# Patient Record
Sex: Male | Born: 1970 | Race: Black or African American | Hispanic: No | Marital: Single | Smoking: Current every day smoker
Health system: Southern US, Community
[De-identification: ages and names within clinical notes are randomized; demographics above are authoritative.]

## PROBLEM LIST (undated history)

## (undated) DIAGNOSIS — R011 Cardiac murmur, unspecified: Secondary | ICD-10-CM

---

## 2004-04-09 ENCOUNTER — Ambulatory Visit: Payer: Self-pay | Admitting: Internal Medicine

## 2004-04-12 ENCOUNTER — Ambulatory Visit: Payer: Self-pay | Admitting: Internal Medicine

## 2004-07-23 ENCOUNTER — Emergency Department (HOSPITAL_COMMUNITY): Admission: EM | Admit: 2004-07-23 | Discharge: 2004-07-23 | Payer: Self-pay | Admitting: Emergency Medicine

## 2005-07-21 ENCOUNTER — Emergency Department (HOSPITAL_COMMUNITY): Admission: EM | Admit: 2005-07-21 | Discharge: 2005-07-21 | Payer: Self-pay | Admitting: Emergency Medicine

## 2006-05-28 ENCOUNTER — Emergency Department (HOSPITAL_COMMUNITY): Admission: EM | Admit: 2006-05-28 | Discharge: 2006-05-28 | Payer: Self-pay | Admitting: *Deleted

## 2006-06-10 ENCOUNTER — Emergency Department (HOSPITAL_COMMUNITY): Admission: EM | Admit: 2006-06-10 | Discharge: 2006-06-10 | Payer: Self-pay | Admitting: Emergency Medicine

## 2006-09-29 ENCOUNTER — Emergency Department (HOSPITAL_COMMUNITY): Admission: EM | Admit: 2006-09-29 | Discharge: 2006-09-29 | Payer: Self-pay | Admitting: Emergency Medicine

## 2007-11-24 ENCOUNTER — Emergency Department (HOSPITAL_COMMUNITY): Admission: EM | Admit: 2007-11-24 | Discharge: 2007-11-24 | Payer: Self-pay | Admitting: Emergency Medicine

## 2008-09-08 ENCOUNTER — Emergency Department (HOSPITAL_COMMUNITY): Admission: EM | Admit: 2008-09-08 | Discharge: 2008-09-08 | Payer: Self-pay | Admitting: *Deleted

## 2008-09-28 ENCOUNTER — Emergency Department (HOSPITAL_COMMUNITY): Admission: EM | Admit: 2008-09-28 | Discharge: 2008-09-28 | Payer: Self-pay | Admitting: Emergency Medicine

## 2009-07-07 ENCOUNTER — Emergency Department (HOSPITAL_COMMUNITY): Admission: EM | Admit: 2009-07-07 | Discharge: 2009-07-07 | Payer: Self-pay | Admitting: Emergency Medicine

## 2010-08-19 ENCOUNTER — Emergency Department (HOSPITAL_COMMUNITY)
Admission: EM | Admit: 2010-08-19 | Discharge: 2010-08-19 | Payer: Self-pay | Source: Home / Self Care | Admitting: Emergency Medicine

## 2010-08-21 LAB — DIFFERENTIAL
Basophils Absolute: 0 10*3/uL (ref 0.0–0.1)
Basophils Relative: 0 % (ref 0–1)
Eosinophils Absolute: 0.4 10*3/uL (ref 0.0–0.7)
Eosinophils Relative: 9 % — ABNORMAL HIGH (ref 0–5)
Lymphocytes Relative: 30 % (ref 12–46)
Lymphs Abs: 1.3 10*3/uL (ref 0.7–4.0)
Monocytes Absolute: 0.4 10*3/uL (ref 0.1–1.0)
Monocytes Relative: 9 % (ref 3–12)
Neutro Abs: 2.2 10*3/uL (ref 1.7–7.7)
Neutrophils Relative %: 52 % (ref 43–77)

## 2010-08-21 LAB — COMPREHENSIVE METABOLIC PANEL
ALT: 19 U/L (ref 0–53)
AST: 23 U/L (ref 0–37)
Albumin: 3.7 g/dL (ref 3.5–5.2)
Alkaline Phosphatase: 90 U/L (ref 39–117)
BUN: 10 mg/dL (ref 6–23)
CO2: 25 mEq/L (ref 19–32)
Calcium: 9.1 mg/dL (ref 8.4–10.5)
Chloride: 105 mEq/L (ref 96–112)
Creatinine, Ser: 0.94 mg/dL (ref 0.4–1.5)
GFR calc Af Amer: >60 mL/min (ref >60)
GFR calc non Af Amer: >60 mL/min (ref >60)
Glucose, Bld: 128 mg/dL — ABNORMAL HIGH (ref 70–99)
Potassium: 3.4 mEq/L — ABNORMAL LOW (ref 3.5–5.1)
Sodium: 138 mEq/L (ref 135–145)
Total Bilirubin: 0.4 mg/dL (ref 0.3–1.2)
Total Protein: 6.7 g/dL (ref 6.0–8.3)

## 2010-08-21 LAB — CBC
HCT: 39.7 % (ref 39.0–52.0)
Hemoglobin: 12.6 g/dL — ABNORMAL LOW (ref 13.0–17.0)
MCH: 26.4 pg (ref 26.0–34.0)
MCHC: 31.7 g/dL (ref 30.0–36.0)
MCV: 83.1 fL (ref 78.0–100.0)
Platelets: 214 10*3/uL (ref 150–400)
RBC: 4.78 MIL/uL (ref 4.22–5.81)
RDW: 13.1 % (ref 11.5–15.5)
WBC: 4.3 10*3/uL (ref 4.0–10.5)

## 2010-08-21 LAB — POCT CARDIAC MARKERS
CKMB, poc: <1 ng/mL — ABNORMAL LOW (ref 1.0–8.0)
Myoglobin, poc: 66 ng/mL (ref 12–200)
Troponin i, poc: <0.05 ng/mL (ref 0.00–0.09)

## 2010-08-21 LAB — D-DIMER, QUANTITATIVE: D-Dimer, Quant: <0.22 ug/mL-FEU (ref 0.00–0.48)

## 2010-11-14 ENCOUNTER — Emergency Department (HOSPITAL_COMMUNITY)
Admission: EM | Admit: 2010-11-14 | Discharge: 2010-11-14 | Disposition: A | Discharge status: Home / Self Care | Payer: Self-pay | Attending: Emergency Medicine | Admitting: Emergency Medicine

## 2010-11-14 DIAGNOSIS — Z202 Contact with and (suspected) exposure to infections with a predominantly sexual mode of transmission: Secondary | ICD-10-CM | POA: Insufficient documentation

## 2010-11-15 LAB — GC/CHLAMYDIA PROBE AMP, GENITAL
Chlamydia, DNA Probe: NEGATIVE
GC Probe Amp, Genital: NEGATIVE

## 2012-03-20 ENCOUNTER — Encounter (HOSPITAL_COMMUNITY): Payer: Self-pay | Admitting: *Deleted

## 2012-03-20 ENCOUNTER — Emergency Department (HOSPITAL_COMMUNITY)
Admission: EM | Admit: 2012-03-20 | Discharge: 2012-03-20 | Disposition: A | Discharge status: Home / Self Care | Payer: Self-pay | Attending: Emergency Medicine | Admitting: Emergency Medicine

## 2012-03-20 DIAGNOSIS — F17200 Nicotine dependence, unspecified, uncomplicated: Secondary | ICD-10-CM | POA: Insufficient documentation

## 2012-03-20 DIAGNOSIS — R55 Syncope and collapse: Secondary | ICD-10-CM | POA: Insufficient documentation

## 2012-03-20 DIAGNOSIS — R631 Polydipsia: Secondary | ICD-10-CM | POA: Insufficient documentation

## 2012-03-20 LAB — CBC WITH DIFFERENTIAL/PLATELET
Basophils Absolute: 0 10*3/uL (ref 0.0–0.1)
Basophils Relative: 0 % (ref 0–1)
Eosinophils Absolute: 0.4 10*3/uL (ref 0.0–0.7)
Eosinophils Relative: 8 % — ABNORMAL HIGH (ref 0–5)
HCT: 42.1 % (ref 39.0–52.0)
Hemoglobin: 13.6 g/dL (ref 13.0–17.0)
Lymphocytes Relative: 25 % (ref 12–46)
Lymphs Abs: 1.1 10*3/uL (ref 0.7–4.0)
MCH: 27 pg (ref 26.0–34.0)
MCHC: 32.3 g/dL (ref 30.0–36.0)
MCV: 83.5 fL (ref 78.0–100.0)
Monocytes Absolute: 0.4 10*3/uL (ref 0.1–1.0)
Monocytes Relative: 10 % (ref 3–12)
Neutro Abs: 2.6 10*3/uL (ref 1.7–7.7)
Neutrophils Relative %: 58 % (ref 43–77)
Platelets: 205 10*3/uL (ref 150–400)
RBC: 5.04 MIL/uL (ref 4.22–5.81)
RDW: 13.4 % (ref 11.5–15.5)
WBC: 4.5 10*3/uL (ref 4.0–10.5)

## 2012-03-20 LAB — POCT I-STAT, CHEM 8
BUN: 13 mg/dL (ref 6–23)
Calcium, Ion: 1.2 mmol/L (ref 1.12–1.23)
Chloride: 104 mEq/L (ref 96–112)
Creatinine, Ser: 1.1 mg/dL (ref 0.50–1.35)
Glucose, Bld: 120 mg/dL — ABNORMAL HIGH (ref 70–99)
HCT: 45 % (ref 39.0–52.0)
Hemoglobin: 15.3 g/dL (ref 13.0–17.0)
Potassium: 3.6 mEq/L (ref 3.5–5.1)
Sodium: 142 mEq/L (ref 135–145)
TCO2: 26 mmol/L (ref 0–100)

## 2012-03-20 LAB — GLUCOSE, CAPILLARY: Glucose-Capillary: 106 mg/dL — ABNORMAL HIGH (ref 70–99)

## 2012-03-20 NOTE — ED Notes (Signed)
The patient is AOx4 and comfortable with the discharge instructions. 

## 2012-03-20 NOTE — ED Notes (Signed)
Pt. C/o generalized  H/a and mild dizziness with ambulation; believes he is anemic.

## 2012-03-20 NOTE — ED Provider Notes (Signed)
History     CSN: [id number]  Arrival date & time 03/20/12  1751   First MD Initiated Contact with Patient 03/20/12 1816      Chief Complaint  Patient presents with  . Polydipsia  . Near Syncope    (Consider location/radiation/quality/duration/timing/severity/associated sxs/prior treatment) HPI Comments: Patient reports a 1 month history of increased thirst, fatigue and numbness in fingertips. He reports being more thirsty this past month and craving sweet foods as well. The cravings and increased thirst have been daily for the past month. He reports being more fatigued than normal. The numbness in his fingertips is more pronounced in the cold, according to the patient. He is concerned that he has diabetes. He does not have a family history of diabetes. He denies abdominal pain, vomiting, blurred vision, polyuria, numbness/tingling of feet/toes.    History reviewed. No pertinent past medical history.  History reviewed. No pertinent past surgical history.  History reviewed. No pertinent family history.  History  Substance Use Topics  . Smoking status: Current Everyday Smoker    Types: Cigarettes  . Smokeless tobacco: Not on file  . Alcohol Use: Yes     occ      Review of Systems  Constitutional: Positive for fatigue. Negative for fever, chills and diaphoresis.  HENT: Negative for trouble swallowing.   Eyes: Negative for visual disturbance.  Respiratory: Negative for shortness of breath and wheezing.   Cardiovascular: Negative for chest pain.  Gastrointestinal: Negative for nausea, vomiting, abdominal pain, diarrhea and constipation.  Genitourinary: Negative for dysuria, frequency, decreased urine volume and difficulty urinating.  Musculoskeletal: Negative for back pain.  Skin: Negative for rash and wound.  Neurological: Positive for numbness. Negative for weakness, light-headedness and headaches.    Allergies  Review of patient's allergies indicates no known  allergies.  Home Medications  No current outpatient prescriptions on file.  BP 96/68  Pulse 95  Temp 98.3 F (36.8 C)  Resp 18  SpO2 97%  Physical Exam  Nursing note and vitals reviewed. Constitutional: He is oriented to person, place, and time. He appears well-developed and well-nourished. No distress.  HENT:  Head: Normocephalic and atraumatic.  Eyes: Conjunctivae are normal. No scleral icterus.  Neck: Normal range of motion.  Cardiovascular: Normal rate and regular rhythm.  Exam reveals no gallop and no friction rub.   No murmur heard. Pulmonary/Chest: Effort normal and breath sounds normal. No respiratory distress. He has no wheezes. He has no rales. He exhibits no tenderness.  Abdominal: Soft. There is no tenderness.  Musculoskeletal: Normal range of motion.  Neurological: He is alert and oriented to person, place, and time.  Skin: Skin is warm and dry. He is not diaphoretic.  Psychiatric: He has a normal mood and affect. His behavior is normal.    ED Course  Procedures (including critical care time)  Labs Reviewed  GLUCOSE, CAPILLARY - Abnormal; Notable for the following:    Glucose-Capillary 106 (*)     All other components within normal limits  CBC WITH DIFFERENTIAL - Abnormal; Notable for the following:    Eosinophils Relative 8 (*)     All other components within normal limits  POCT I-STAT, CHEM 8 - Abnormal; Notable for the following:    Glucose, Bld 120 (*)     All other components within normal limits   No results found.   No diagnosis found.    MDM  6:41 PM Patient is concerned about having diabetes. His current blood glucose is 120 and  the patient reports not eating since yesterday. I have ordered a CBC to check for anemia. I explained to him he would ultimately have to follow up with a Primary Care Physician to have a fasting glucose check. His blood sugar is not high enough to diagnose him with diabetes today. He should try to eat more protein and  less sugar to control any high increases in blood sugar.   7:24 PM Patient has no signs of infection or anemia based on lab results. I will discharge him with instructions for diet modification and follow up with a low-cost primary care doctor in the area. Plan discussed with Dr. Ignacia Palma who is agreeable.      Emilia Beck, PA-C 03/20/12 [phone]

## 2012-03-20 NOTE — ED Notes (Signed)
Pt reports having excessive thirst and occ near syncope episodes, wants to be checked for anemia and diabetes. No distress noted at triage, cbg 106.

## 2012-03-21 NOTE — ED Provider Notes (Signed)
Medical screening examination/treatment/procedure(s) were performed by non-physician practitioner and as supervising physician I was immediately available for consultation/collaboration.   Carleene Cooper III, MD 03/21/12 [phone]

## 2012-06-05 ENCOUNTER — Encounter (HOSPITAL_COMMUNITY): Payer: Self-pay | Admitting: *Deleted

## 2012-06-05 ENCOUNTER — Emergency Department (HOSPITAL_COMMUNITY)
Admission: EM | Admit: 2012-06-05 | Discharge: 2012-06-05 | Disposition: A | Discharge status: Home / Self Care | Payer: Self-pay | Attending: Emergency Medicine | Admitting: Emergency Medicine

## 2012-06-05 DIAGNOSIS — F17200 Nicotine dependence, unspecified, uncomplicated: Secondary | ICD-10-CM | POA: Insufficient documentation

## 2012-06-05 DIAGNOSIS — R229 Localized swelling, mass and lump, unspecified: Secondary | ICD-10-CM

## 2012-06-05 DIAGNOSIS — R221 Localized swelling, mass and lump, neck: Secondary | ICD-10-CM | POA: Insufficient documentation

## 2012-06-05 DIAGNOSIS — R220 Localized swelling, mass and lump, head: Secondary | ICD-10-CM | POA: Insufficient documentation

## 2012-06-05 MED ORDER — DIPHENHYDRAMINE HCL 25 MG PO TABS: 25.0000 mg | ORAL_TABLET | Freq: Four times a day (QID) | ORAL | Status: DC

## 2012-06-05 NOTE — ED Notes (Signed)
Pt has swollen area above left eyebrow that he states started a week and a half ago. Pt states that the swelling decreased twice since then and that it started swelling for the third time a couple of days ago.

## 2012-06-05 NOTE — ED Notes (Signed)
Pt has small swollen area above left eyebrow. Denies pain states "it itches a lot though." No redness noted.

## 2012-06-05 NOTE — ED Provider Notes (Signed)
History     CSN: [id number]  Arrival date & time 06/05/12  1912   First MD Initiated Contact with Patient 06/05/12 2001      Chief Complaint  Patient presents with  . recurrent bump on head     (Consider location/radiation/quality/duration/timing/severity/associated sxs/prior treatment) HPI Comments: 41 year old male presents the emergency department complaining of an intermittent swollen area above his left eyebrow that he noted a week and half ago. He states the swelling has only come and gone twice and is very itchy when it is fair. He believes he may of gotten bit by something. Denies visual changes, rashes, fever or chills. He has not tried any alleviating factors. The swelling is not tender. Nothing in specific makes it come and go.  The history is provided by the patient.    History reviewed. No pertinent past medical history.  History reviewed. No pertinent past surgical history.  History reviewed. No pertinent family history.  History  Substance Use Topics  . Smoking status: Current Every Day Smoker    Types: Cigarettes  . Smokeless tobacco: Not on file  . Alcohol Use: Yes     occ      Review of Systems  Constitutional: Negative for fever and chills.  HENT: Negative for neck pain and neck stiffness.   Eyes: Negative for visual disturbance.  Respiratory: Negative for shortness of breath.   Cardiovascular: Negative for chest pain.  Musculoskeletal: Negative for arthralgias.  Skin: Negative for color change and rash.       Positive for swollen area  Neurological: Negative for headaches.    Allergies  Review of patient's allergies indicates no known allergies.  Home Medications  No current outpatient prescriptions on file.  BP 120/81  Temp 98.1 F (36.7 C) (Oral)  Resp 18  SpO2 96%  Physical Exam  Nursing note and vitals reviewed. Constitutional: He is oriented to person, place, and time.  HENT:  Head: Normocephalic.    Eyes: Conjunctivae  normal and EOM are normal.  Neck: Normal range of motion. Neck supple.  Cardiovascular: Normal rate, regular rhythm and normal heart sounds.   Pulmonary/Chest: Effort normal and breath sounds normal.  Musculoskeletal: Normal range of motion. He exhibits no edema.  Lymphadenopathy:    He has no cervical adenopathy.  Neurological: He is alert and oriented to person, place, and time.  Skin: Skin is warm, dry and intact. No rash noted.  Psychiatric: He has a normal mood and affect. His behavior is normal.    ED Course  Procedures (including critical care time)  Labs Reviewed - No data to display No results found.   1. Skin swelling       MDM  Wheel over left eyebrow consistent with mosquito bite. Not painful. No discharge. No evidence of infection. Will give benadryl for itch. Advised cold compressed and ibuprofen if needed.       Trevor Mace, PA-C 06/05/12 2043

## 2012-06-06 NOTE — ED Provider Notes (Signed)
Medical screening examination/treatment/procedure(s) were performed by non-physician practitioner and as supervising physician I was immediately available for consultation/collaboration.   Glynn Octave, MD 06/06/12 1155

## 2012-11-05 ENCOUNTER — Encounter (HOSPITAL_COMMUNITY): Payer: Self-pay | Admitting: *Deleted

## 2012-11-05 ENCOUNTER — Emergency Department (HOSPITAL_COMMUNITY)
Admission: EM | Admit: 2012-11-05 | Discharge: 2012-11-05 | Disposition: A | Discharge status: Home / Self Care | Payer: Self-pay | Attending: Emergency Medicine | Admitting: Emergency Medicine

## 2012-11-05 DIAGNOSIS — M545 Low back pain, unspecified: Secondary | ICD-10-CM | POA: Insufficient documentation

## 2012-11-05 DIAGNOSIS — F17200 Nicotine dependence, unspecified, uncomplicated: Secondary | ICD-10-CM | POA: Insufficient documentation

## 2012-11-05 DIAGNOSIS — M549 Dorsalgia, unspecified: Secondary | ICD-10-CM

## 2012-11-05 MED ORDER — OXYCODONE-ACETAMINOPHEN 5-325 MG PO TABS: 1.0000 | ORAL_TABLET | ORAL | Status: DC | PRN

## 2012-11-05 MED ORDER — IBUPROFEN 800 MG PO TABS
800.0000 mg | ORAL_TABLET | Freq: Once | ORAL | Status: AC
  Administered 2012-11-05: 800 mg via ORAL
  Filled 2012-11-05: qty 1

## 2012-11-05 NOTE — ED Provider Notes (Signed)
History     CSN: [id number]  Arrival date & time 11/05/12  0220   First MD Initiated Contact with Patient 11/05/12 0230      Chief Complaint  Patient presents with  . Back Pain     Patient is a 42 y.o. male presenting with back pain. The history is provided by the patient.  Back Pain Location:  Gluteal region Quality:  Aching Radiates to:  R thigh Pain severity:  Moderate Pain is:  Same all the time Onset quality:  Gradual Duration:  1 month Timing:  Intermittent Progression:  Worsening Chronicity:  New Relieved by:  Lying down Worsened by:  Ambulation Associated symptoms: no abdominal pain, no bladder incontinence, no bowel incontinence, no fever, no numbness, no weakness and no weight loss   Risk factors: no hx of cancer and no recent surgery      PMH - none No previous back surgeries No h/o IVDA   History  Substance Use Topics  . Smoking status: Current Every Day Smoker    Types: Cigarettes  . Smokeless tobacco: Not on file  . Alcohol Use: Yes     Comment: occ      Review of Systems  Constitutional: Negative for fever, weight loss and unexpected weight change.  Respiratory: Negative for shortness of breath.   Gastrointestinal: Negative for abdominal pain and bowel incontinence.  Genitourinary: Negative for bladder incontinence and difficulty urinating.  Musculoskeletal: Positive for back pain.  Neurological: Negative for weakness and numbness.  Psychiatric/Behavioral: Negative for agitation.  All other systems reviewed and are negative.    Allergies  Review of patient's allergies indicates no known allergies.  Home Medications   Current Outpatient Rx  Name  Route  Sig  Dispense  Refill  . oxyCODONE-acetaminophen (PERCOCET/ROXICET) 5-325 MG per tablet   Oral   Take 1 tablet by mouth every 4 (four) hours as needed for pain.   15 tablet   0     BP 124/76  Pulse 74  Temp(Src) 98.2 F (36.8 C) (Oral)  Resp 20  SpO2 97%  Physical  Exam CONSTITUTIONAL: Well developed/well nourished HEAD: Normocephalic/atraumatic EYES: EOMI/PERRL ENMT: Mucous membranes moist NECK: supple no meningeal signs SPINE:entire spine nontender, No bruising/crepitance/stepoffs noted to spine He has tenderness in right gluteal region but no bruising or erythema noted CV: S1/S2 noted, no murmurs/rubs/gallops noted LUNGS: Lungs are clear to auscultation bilaterally, no apparent distress ABDOMEN: soft, nontender, no rebound or guarding GU:no cva tenderness NEURO: Awake/alert, equal distal motor: hip flexion/knee flexion/extension, ankle dorsi/plantar flexion, great toe extension intact bilaterally, no clonus bilaterally, plantar reflex appropriate, no apparent sensory deficit in any dermatome.  Equal patellar/achilles reflex noted.  Pt is able to ambulate. EXTREMITIES: pulses normal, full ROM SKIN: warm, color normal PSYCH: no abnormalities of mood noted     ED Course  Procedures   1. Back pain     Pt well appeairng.  He has no neuro deficits.  He reports increased pain this month due to new job We discussed strict return precautions and proper back mechanics.  Stable for d/c  MDM  Nursing notes including past medical history and social history reviewed and considered in documentation         Joya Gaskins, MD 11/05/12 [phone]

## 2012-11-05 NOTE — ED Notes (Signed)
The pt has had lower back pain for one month.  No nv or diarrhea

## 2012-11-26 ENCOUNTER — Encounter (HOSPITAL_COMMUNITY): Payer: Self-pay | Admitting: *Deleted

## 2012-11-26 ENCOUNTER — Emergency Department (HOSPITAL_COMMUNITY)
Admission: EM | Admit: 2012-11-26 | Discharge: 2012-11-26 | Disposition: A | Discharge status: Home / Self Care | Payer: Self-pay | Attending: Emergency Medicine | Admitting: Emergency Medicine

## 2012-11-26 ENCOUNTER — Emergency Department (HOSPITAL_COMMUNITY): Payer: Self-pay

## 2012-11-26 DIAGNOSIS — J069 Acute upper respiratory infection, unspecified: Secondary | ICD-10-CM | POA: Insufficient documentation

## 2012-11-26 DIAGNOSIS — F17200 Nicotine dependence, unspecified, uncomplicated: Secondary | ICD-10-CM | POA: Insufficient documentation

## 2012-11-26 DIAGNOSIS — R509 Fever, unspecified: Secondary | ICD-10-CM | POA: Insufficient documentation

## 2012-11-26 NOTE — ED Notes (Signed)
Pt states that he is have congestion, slight productive cough, and chest soreness. Pt denies N/V/D, but states that he has been having sweating episodes and chills.

## 2012-11-26 NOTE — ED Provider Notes (Signed)
History     CSN: [id number]  Arrival date & time 11/26/12  0016   First MD Initiated Contact with Patient 11/26/12 [phone]      Chief Complaint  Patient presents with  . Cold like symptoms     Patient is a 42 y.o. male presenting with cough. The history is provided by the patient.  Cough Cough characteristics:  Productive Severity:  Mild Onset quality:  Gradual Duration:  1 day Timing:  Intermittent Chronicity:  New Smoker: yes   Relieved by:  None tried Associated symptoms: chills, fever and sinus congestion    Pt reports that starting yesterday he began to have sinus congestion and cough (denies hemoptysis) He reports he will have brief CP while coughing, but no other CP reported He reports mild SOB  PMH - none  History reviewed. No pertinent past surgical history.  History reviewed. No pertinent family history.  History  Substance Use Topics  . Smoking status: Current Every Day Smoker    Types: Cigarettes  . Smokeless tobacco: Not on file  . Alcohol Use: Yes     Comment: occ      Review of Systems  Constitutional: Positive for fever and chills.  Respiratory: Positive for cough.     Allergies  Review of patient's allergies indicates no known allergies.  Home Medications  No current outpatient prescriptions on file.  BP 126/87  Pulse 72  Temp(Src) 98 F (36.7 C) (Oral)  Resp 16  SpO2 100%  Physical Exam CONSTITUTIONAL: Well developed/well nourished HEAD: Normocephalic/atraumatic EYES: EOMI/PERRL ENMT: Mucous membranes moist, uvula midline, pharynx nonerythematous NECK: supple no meningeal signs CV: S1/S2 noted, no murmurs/rubs/gallops noted LUNGS: Lungs are clear to auscultation bilaterally, no apparent distress ABDOMEN: soft, nontender, no rebound or guarding NEURO: Pt is awake/alert, moves all extremitiesx4 EXTREMITIES: pulses normal, full ROM SKIN: warm, color normal PSYCH: no abnormalities of mood noted  ED Course  Procedures (including  critical care time)  Labs Reviewed - No data to display Dg Chest 2 View  11/26/2012  *RADIOLOGY REPORT*  Clinical Data: Chest pain  CHEST - 2 VIEW  Comparison: 08/19/2010  Findings: Lungs are clear. No pleural effusion or pneumothorax. The cardiomediastinal contours are within normal limits. The visualized bones and soft tissues are without significant appreciable abnormality.  IMPRESSION: No radiographic evidence of acute cardiopulmonary process.   Original Report Authenticated By: Jearld Lesch, M.D.      1. URI (upper respiratory infection)     Pt well appearing, reporting cough with congestion and CP only with cough.  I doubt PE/ACS or other cause of his CP.  He is well appearing without hypoxia on Room air.  Stable for d/c  MDM  Nursing notes including past medical history and social history reviewed and considered in documentation xrays reviewed and considered        Date: 11/26/2012  Rate: 90  Rhythm: normal sinus rhythm  QRS Axis: normal  Intervals: normal  ST/T Wave abnormalities: early repolarization  Conduction Disutrbances:none  Narrative Interpretation:   Old EKG Reviewed: none available at time of interpretation    Joya Gaskins, MD 11/26/12 [phone]

## 2013-01-12 ENCOUNTER — Emergency Department (HOSPITAL_COMMUNITY)
Admission: EM | Admit: 2013-01-12 | Discharge: 2013-01-12 | Disposition: A | Discharge status: Home / Self Care | Payer: Self-pay | Attending: Emergency Medicine | Admitting: Emergency Medicine

## 2013-01-12 DIAGNOSIS — F17200 Nicotine dependence, unspecified, uncomplicated: Secondary | ICD-10-CM | POA: Insufficient documentation

## 2013-01-12 DIAGNOSIS — M5412 Radiculopathy, cervical region: Secondary | ICD-10-CM | POA: Insufficient documentation

## 2013-01-12 DIAGNOSIS — Z791 Long term (current) use of non-steroidal anti-inflammatories (NSAID): Secondary | ICD-10-CM | POA: Insufficient documentation

## 2013-01-12 MED ORDER — NAPROXEN 500 MG PO TABS: 500.0000 mg | ORAL_TABLET | Freq: Two times a day (BID) | ORAL | Status: DC

## 2013-01-12 NOTE — ED Provider Notes (Signed)
Medical screening examination/treatment/procedure(s) were performed by non-physician practitioner and as supervising physician I was immediately available for consultation/collaboration.  Lyanne Co, MD 01/12/13 [phone]

## 2013-01-12 NOTE — ED Provider Notes (Signed)
History     CSN: [id number]  Arrival date & time 01/12/13  0248   First MD Initiated Contact with Patient 01/12/13 0518      No chief complaint on file.   (Consider location/radiation/quality/duration/timing/severity/associated sxs/prior treatment) HPI Comments: Patient presents to the ED with chief complaint of left arm numbness and tingling x3-4 weeks. States that his been intermittent. 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 He has not tried anything to alleviate his symptoms. Nothing makes his symptoms better or worse. The pain radiates from his neck down his arm on the left side. He denies any chest pain, shortness of breath, nausea, vomiting, diarrhea, constipation, numbness or tingling of the extremities.  The history is provided by the patient. No language interpreter was used.    No past medical history on file.  No past surgical history on file.  No family history on file.  History  Substance Use Topics  . Smoking status: Current Every Day Smoker    Types: Cigarettes  . Smokeless tobacco: Not on file  . Alcohol Use: Yes     Comment: occ      Review of Systems  All other systems reviewed and are negative.    Allergies  Review of patient's allergies indicates no known allergies.  Home Medications   Current Outpatient Rx  Name  Route  Sig  Dispense  Refill  . naproxen (NAPROSYN) 500 MG tablet   Oral   Take 1 tablet (500 mg total) by mouth 2 (two) times daily with a meal.   30 tablet   0     BP 110/73  Pulse 75  SpO2 99%  Physical Exam  Nursing note and vitals reviewed. Constitutional: He is oriented to person, place, and time. He appears well-developed and well-nourished.  HENT:  Head: Normocephalic and atraumatic.  Eyes: Conjunctivae and EOM are normal. Pupils are equal, round, and reactive to light. Right eye exhibits no discharge. Left eye exhibits no discharge. No scleral icterus.   Neck: Normal range of motion. Neck supple. No JVD present.  Cardiovascular: Normal rate, regular rhythm, normal heart sounds and intact distal pulses.  Exam reveals no gallop and no friction rub.   No murmur heard. Pulmonary/Chest: Effort normal and breath sounds normal. No respiratory distress. He has no wheezes. He has no rales. He exhibits no tenderness.  Abdominal: Soft. Bowel sounds are normal. He exhibits no distension and no mass. There is no tenderness. There is no rebound and no guarding.  Musculoskeletal: Normal range of motion. He exhibits no edema and no tenderness.  Range of motion and strength 5/5  Neurological: He is alert and oriented to person, place, and time. He has normal reflexes.  CN 3-12 intact, sensation and strength intact  Skin: Skin is warm and dry.  Psychiatric: He has a normal mood and affect. His behavior is normal. Judgment and thought content normal.    ED Course  Procedures (including critical care time)  Labs Reviewed - No data to display No results found.   1. Cervical radiculopathy       MDM  Patient with left arm numbness and tingling x3-4 weeks. Based on patient's work history and physical exam, I suspect that the patient is likely experiencing a cervical radiculopathy. Patient's otherwise healthy. Will discharge the patient with Naprosyn, and orthopedic followup. Recommended physical therapy and rice therapy. Patient understands and agrees with the plan. He is stable and ready for  discharge.        Roxy Horseman, PA-C 01/12/13 [phone]

## 2013-01-12 NOTE — ED Notes (Addendum)
See [place] charting for previous patient documentation  

## 2013-10-11 ENCOUNTER — Encounter (HOSPITAL_COMMUNITY): Payer: Self-pay | Admitting: Emergency Medicine

## 2013-10-11 ENCOUNTER — Emergency Department (HOSPITAL_COMMUNITY): Payer: No Typology Code available for payment source

## 2013-10-11 ENCOUNTER — Emergency Department (HOSPITAL_COMMUNITY)
Admission: EM | Admit: 2013-10-11 | Discharge: 2013-10-11 | Disposition: A | Discharge status: Home / Self Care | Payer: No Typology Code available for payment source | Attending: Emergency Medicine | Admitting: Emergency Medicine

## 2013-10-11 DIAGNOSIS — R6883 Chills (without fever): Secondary | ICD-10-CM | POA: Insufficient documentation

## 2013-10-11 DIAGNOSIS — R079 Chest pain, unspecified: Secondary | ICD-10-CM | POA: Insufficient documentation

## 2013-10-11 DIAGNOSIS — F17200 Nicotine dependence, unspecified, uncomplicated: Secondary | ICD-10-CM | POA: Insufficient documentation

## 2013-10-11 LAB — BASIC METABOLIC PANEL
BUN: 10 mg/dL (ref 6–23)
CO2: 29 mEq/L (ref 19–32)
Calcium: 9.3 mg/dL (ref 8.4–10.5)
Chloride: 100 mEq/L (ref 96–112)
Creatinine, Ser: 0.89 mg/dL (ref 0.50–1.35)
GFR calc Af Amer: >90 mL/min (ref >90)
GFR calc non Af Amer: >90 mL/min (ref >90)
Glucose, Bld: 78 mg/dL (ref 70–99)
Potassium: 3.7 mEq/L (ref 3.7–5.3)
Sodium: 140 mEq/L (ref 137–147)

## 2013-10-11 LAB — D-DIMER, QUANTITATIVE (NOT AT ARMC): D-Dimer, Quant: 0.3 ug/mL-FEU (ref 0.00–0.48)

## 2013-10-11 LAB — CBC
HCT: 41.1 % (ref 39.0–52.0)
Hemoglobin: 13.5 g/dL (ref 13.0–17.0)
MCH: 27.2 pg (ref 26.0–34.0)
MCHC: 32.8 g/dL (ref 30.0–36.0)
MCV: 82.9 fL (ref 78.0–100.0)
Platelets: 183 10*3/uL (ref 150–400)
RBC: 4.96 MIL/uL (ref 4.22–5.81)
RDW: 13.5 % (ref 11.5–15.5)
WBC: 6.5 10*3/uL (ref 4.0–10.5)

## 2013-10-11 LAB — I-STAT TROPONIN, ED: Troponin i, poc: 0.01 ng/mL (ref 0.00–0.08)

## 2013-10-11 MED ORDER — HYDROCODONE-ACETAMINOPHEN 5-325 MG PO TABS: 1.0000 | ORAL_TABLET | Freq: Four times a day (QID) | ORAL | Status: DC | PRN

## 2013-10-11 MED ORDER — MORPHINE SULFATE 4 MG/ML IJ SOLN
4.0000 mg | Freq: Once | INTRAMUSCULAR | Status: AC
  Administered 2013-10-11: 4 mg via INTRAVENOUS
  Filled 2013-10-11: qty 1

## 2013-10-11 MED ORDER — NAPROXEN 500 MG PO TABS: 500.0000 mg | ORAL_TABLET | Freq: Two times a day (BID) | ORAL | Status: DC

## 2013-10-11 MED ORDER — ONDANSETRON HCL 4 MG/2ML IJ SOLN
4.0000 mg | Freq: Once | INTRAMUSCULAR | Status: AC
  Administered 2013-10-11: 4 mg via INTRAVENOUS
  Filled 2013-10-11: qty 2

## 2013-10-11 MED ORDER — HYDROMORPHONE HCL PF 1 MG/ML IJ SOLN
1.0000 mg | Freq: Once | INTRAMUSCULAR | Status: AC
  Administered 2013-10-11: 1 mg via INTRAVENOUS
  Filled 2013-10-11: qty 1

## 2013-10-11 NOTE — ED Notes (Signed)
Pt presents with Left side chest pain radiating to his Left back and pain with deep breathing, pt describes the pain as sharp, nothing makes the pain better or worse. Pt took 2 Advil's prior to arrival

## 2013-10-11 NOTE — ED Notes (Addendum)
Contacted lab x 2 about D Dimer result. Lab sample just now being processed. PA made aware. Pt made aware of delay. Pt resting comfortably in bed.

## 2013-10-11 NOTE — ED Notes (Signed)
Pt reports progressively worsening left chest pain radiating to left back x 3 days; progressively worsening. States it is worse with inspiration, palpation and movement. Denies N/V/diaphoresis. Pt reports pain as 10/10 sharp pain.

## 2013-10-11 NOTE — ED Provider Notes (Signed)
CSN: [id number]     Arrival date & time 10/11/13  [id number] History   First MD Initiated Contact with Patient 10/11/13 1110     Chief Complaint  Patient presents with  . Chest Pain     (Consider location/radiation/quality/duration/timing/severity/associated sxs/prior Treatment) HPI Comments: Patient is a 43 year old male with history of palpitations who presents today with chest pain since Friday. He reports it is a severe, sharp pain that radiates from his left chest to his back. It comes approximately every 5 minutes. It is triggered by taking deep breaths, but will also come on when he is sitting quietly. Pain is worse with movement as well. He took Advil without relief on Saturday. He has not taken anything today. He has never had pain like this in the past. He denies any cardiac history, other than feeling intermittent palpitations for years. He denies cough, long trips, recent surgeries, leg swelling, history of DVT or PE in the past.   Patient is a 43 y.o. male presenting with chest pain. The history is provided by the patient. No language interpreter was used.  Chest Pain Associated symptoms: no abdominal pain, no cough, no fever, no nausea, no shortness of breath and not vomiting     History reviewed. No pertinent past medical history. History reviewed. No pertinent past surgical history. History reviewed. No pertinent family history. History  Substance Use Topics  . Smoking status: Current Every Day Smoker    Types: Cigarettes  . Smokeless tobacco: Not on file  . Alcohol Use: Yes     Comment: occ    Review of Systems  Constitutional: Positive for chills. Negative for fever.  Respiratory: Negative for cough and shortness of breath.   Cardiovascular: Positive for chest pain. Negative for leg swelling.  Gastrointestinal: Negative for nausea, vomiting and abdominal pain.  All other systems reviewed and are negative.      Allergies  Review of patient's allergies indicates no  known allergies.  Home Medications   Current Outpatient Rx  Name  Route  Sig  Dispense  Refill  . ibuprofen (ADVIL,MOTRIN) 200 MG tablet   Oral   Take 400 mg by mouth every 6 (six) hours as needed for fever, headache or mild pain.          BP 114/79  Pulse 89  Temp(Src) 98.3 F (36.8 C) (Oral)  Resp 18  Ht 6\' 1"  (1.854 m)  Wt 162 lb 3.2 oz (73.573 kg)  BMI 21.40 kg/m2  SpO2 98% Physical Exam  Nursing note and vitals reviewed. Constitutional: He is oriented to person, place, and time. He appears well-developed and well-nourished.  Non-toxic appearance. He does not have a sickly appearance. He does not appear ill. No distress.  HENT:  Head: Normocephalic and atraumatic.  Right Ear: External ear normal.  Left Ear: External ear normal.  Nose: Nose normal.  Mouth/Throat: Oropharynx is clear and moist.  Eyes: Conjunctivae are normal.  Neck: Normal range of motion. No tracheal deviation present.  Cardiovascular: Normal rate, regular rhythm, normal heart sounds, intact distal pulses and normal pulses.   Pulses:      Radial pulses are 2+ on the right side, and 2+ on the left side.       Posterior tibial pulses are 2+ on the right side, and 2+ on the left side.  Pulmonary/Chest: Effort normal and breath sounds normal. No stridor.  Abdominal: Soft. He exhibits no distension. There is no tenderness.  Musculoskeletal: Normal range of motion.  Strength 5/5 in all extremities  Neurological: He is alert and oriented to person, place, and time.  Skin: Skin is warm and dry. He is not diaphoretic.  Psychiatric: He has a normal mood and affect. His behavior is normal.    ED Course  Procedures (including critical care time) Labs Review Labs Reviewed  CBC  BASIC METABOLIC PANEL  D-DIMER, QUANTITATIVE  Rosezena Sensor, ED   Imaging Review Dg Chest 2 View  10/11/2013   CLINICAL DATA:  Chest pain  EXAM: CHEST  2 VIEW  COMPARISON:  November 26, 2012  FINDINGS: The lungs are clear. The  heart size and pulmonary vascularity are normal. No adenopathy. No pneumothorax. No bone lesions.  IMPRESSION: No abnormality noted.   Electronically Signed   By: Bretta Bang M.D.   On: 10/11/2013 09:55     EKG Interpretation   Date/Time:  Tuesday October 11 2013 09:31:10 EDT Ventricular Rate:  98 PR Interval:  148 QRS Duration: 86 QT Interval:  326 QTC Calculation: 416 R Axis:   79 Text Interpretation:  Normal sinus rhythm Right atrial enlargement  Borderline ECG No significant change since last tracing Confirmed by KNAPP   MD-J, JON ([id number]) on 10/11/2013 12:01:10 PM      MDM   Final diagnoses:  Chest pain   Patient presents to the emergency department for evaluation of chest pain. Chest pain has been frequent, but intermittent since Friday. Pain is sharp and worse with movement, palpation, and deep breathing. No cardiac hx or family history of early cardiac disease. Patient is normotensive. D dimer negative. No concern for PE or aneurysm. Patient was given a resource guide to follow up with a PCP. Discussed reasons to return to the ED immediately. Vital signs stable for discharge. Discussed case with Dr. Lynelle Doctor who agrees with plan. Patient / Family / Caregiver informed of clinical course, understand medical decision-making process, and agree with plan.   Mora Bellman, PA-C 10/11/13 1504

## 2013-10-11 NOTE — ED Notes (Addendum)
Pt presents with upper body stiffness, unable to move his arms with out assistance, pt asked if maybe he pulled a muscle, pt states "no, I haven't injured myself at all." But does admit increase pain to his chest with upper body movement

## 2013-10-11 NOTE — Discharge Instructions (Signed)
Chest Pain (Nonspecific) °It is often hard to give a specific diagnosis for the cause of chest pain. There is always a chance that your pain could be related to something serious, such as a heart attack or a blood clot in the lungs. You need to follow up with your caregiver for further evaluation. °CAUSES  °· Heartburn. °· Pneumonia or bronchitis. °· Anxiety or stress. °· Inflammation around your heart (pericarditis) or lung (pleuritis or pleurisy). °· A blood clot in the lung. °· A collapsed lung (pneumothorax). It can develop suddenly on its own (spontaneous pneumothorax) or from injury (trauma) to the chest. °· Shingles infection (herpes zoster virus). °The chest wall is composed of bones, muscles, and cartilage. Any of these can be the source of the pain. °· The bones can be bruised by injury. °· The muscles or cartilage can be strained by coughing or overwork. °· The cartilage can be affected by inflammation and become sore (costochondritis). °DIAGNOSIS  °Lab tests or other studies, such as X-rays, electrocardiography, stress testing, or cardiac imaging, may be needed to find the cause of your pain.  °TREATMENT  °· Treatment depends on what may be causing your chest pain. Treatment may include: °· Acid blockers for heartburn. °· Anti-inflammatory medicine. °· Pain medicine for inflammatory conditions. °· Antibiotics if an infection is present. °· You may be advised to change lifestyle habits. This includes stopping smoking and avoiding alcohol, caffeine, and chocolate. °· You may be advised to keep your head raised (elevated) when sleeping. This reduces the chance of acid going backward from your stomach into your esophagus. °· Most of the time, nonspecific chest pain will improve within 2 to 3 days with rest and mild pain medicine. °HOME CARE INSTRUCTIONS  °· If antibiotics were prescribed, take your antibiotics as directed. Finish them even if you start to feel better. °· For the next few days, avoid physical  activities that bring on chest pain. Continue physical activities as directed. °· Do not smoke. °· Avoid drinking alcohol. °· Only take over-the-counter or prescription medicine for pain, discomfort, or fever as directed by your caregiver. °· Follow your caregiver's suggestions for further testing if your chest pain does not go away. °· Keep any follow-up appointments you made. If you do not go to an appointment, you could develop lasting (chronic) problems with pain. If there is any problem keeping an appointment, you must call to reschedule. °SEEK MEDICAL CARE IF:  °· You think you are having problems from the medicine you are taking. Read your medicine instructions carefully. °· Your chest pain does not go away, even after treatment. °· You develop a rash with blisters on your chest. °SEEK IMMEDIATE MEDICAL CARE IF:  °· You have increased chest pain or pain that spreads to your arm, neck, jaw, back, or abdomen. °· You develop shortness of breath, an increasing cough, or you are coughing up blood. °· You have severe back or abdominal pain, feel nauseous, or vomit. °· You develop severe weakness, fainting, or chills. °· You have a fever. °THIS IS AN EMERGENCY. Do not wait to see if the pain will go away. Get medical help at once. Call your local emergency services (911 in [place] Do not drive yourself to the hospital. °MAKE SURE YOU:  °· Understand these instructions. °· Will watch your condition. °· Will get help right away if you are not doing well or get worse. °Document Released: 04/30/2005 Document Revised: 10/13/2011 Document Reviewed: 02/24/2008 °ExitCare® Patient Information ©2014 ExitCare,   LLC.  Chest Wall Pain Chest wall pain is pain in or around the bones and muscles of your chest. It may take up to 6 weeks to get better. It may take longer if you must stay physically active in your work and activities.  CAUSES  Chest wall pain may happen on its own. However, it may be caused by:  A viral illness  like the flu.  Injury.  Coughing.  Exercise.  Arthritis.  Fibromyalgia.  Shingles. HOME CARE INSTRUCTIONS   Avoid overtiring physical activity. Try not to strain or perform activities that cause pain. This includes any activities using your chest or your abdominal and side muscles, especially if heavy weights are used.  Put ice on the sore area.  Put ice in a plastic bag.  Place a towel between your skin and the bag.  Leave the ice on for 15-20 minutes per hour while awake for the first 2 days.  Only take over-the-counter or prescription medicines for pain, discomfort, or fever as directed by your caregiver. SEEK IMMEDIATE MEDICAL CARE IF:   Your pain increases, or you are very uncomfortable.  You have a fever.  Your chest pain becomes worse.  You have new, unexplained symptoms.  You have nausea or vomiting.  You feel sweaty or lightheaded.  You have a cough with phlegm (sputum), or you cough up blood. MAKE SURE YOU:   Understand these instructions.  Will watch your condition.  Will get help right away if you are not doing well or get worse. Document Released: 07/21/2005 Document Revised: 10/13/2011 Document Reviewed: 03/17/2011 [facility] Patient Information 2014 [place], [place].   Emergency Department Resource Guide 1) Find a Doctor and Pay Out of Pocket Although you won't have to find out who is covered by your insurance plan, it is a good idea to ask around and get recommendations. You will then need to call the office and see if the doctor you have chosen will accept you as a new patient and what types of options they offer for patients who are self-pay. Some doctors offer discounts or will set up payment plans for their patients who do not have insurance, but you will need to ask so you aren't surprised when you get to your appointment.  2) Contact Your Local Health Department Not all health departments have doctors that can see patients for sick visits,  but many do, so it is worth a call to see if yours does. If you don't know where your local health department is, you can check in your phone book. The CDC also has a tool to help you locate your state's health department, and many state websites also have listings of all of their local health departments.  3) Find a [facility] If your illness is not likely to be very severe or complicated, you may want to try a walk in clinic. These are popping up all over the country in pharmacies, drugstores, and shopping centers. They're usually staffed by nurse practitioners or physician assistants that have been trained to treat common illnesses and complaints. They're usually fairly quick and inexpensive. However, if you have serious medical issues or chronic medical problems, these are probably not your best option.  No Primary Care Doctor: - Call Health Connect at  [phone] - they can help you locate a primary care doctor that  accepts your insurance, provides certain services, etc. - Physician Referral Service- [phone]  Chronic Pain Problems: Organization  Address  Phone   Notes  [facility]  [phone] Patients need to be referred by their primary care doctor.   Medication Assistance: Organization         Address  Phone   Notes  [facility] Medication [facility] [address] [place], [place] [address] [phone] --Must be a resident of [facility] -- Must have NO insurance coverage whatsoever (no Medicaid/ Medicare, etc.) -- The pt. MUST have a primary care doctor that directs their care regularly and follows them in the community   MedAssist  [phone]   [employer]  [phone]    Agencies that provide inexpensive medical care: Organization         Address  Phone   Notes  [place]  [phone]   [facility]    [phone]   [facility] [place], [place] [address] [phone]   [place] [address] [place] [phone]   Planned Parenthood    [phone]   [facility]    [phone]   [place] and [place] [address], [place] Phone:  [phone], Fax:  [phone] Hours of Operation:  9 am - 6 pm, M-F.  Also accepts Medicaid/Medicare and self-pay.  [facility] for [address], [address], [place] Phone: [phone], Fax: [phone]. Hours of Operation:  8:30 am - 5:30 pm, M-F.  Also accepts Medicaid and self-pay.  [facility] [place] [address], [place] Phone: [phone]   [place], [place], [place] [phone], Ext. [phone] Mondays & Thursdays: 7-9 AM.  First 15 patients are seen on a first come, first serve basis.    [place] Providers:  Organization         Address  Phone   Notes  [facility] [address], [place] [phone] Also accepts self-pay patients.  [facility] [address], [place]  [phone]   [place], [address], [place] [phone]   [facility] [address], [place] [phone]   Lucianne Lei [address], [place]   [phone] Only accepts [place] Access [place] patients after they have their name applied to their card.   Self-Pay (no insurance) in [facility]:  Organization         Address  Phone   Notes  Sickle Cell Patients, [facility] [place] [phone]   [facility] [place] [phone]   [facility] [place]  [place], [place], [place] [phone]   [facility]/Dr. Osei-Bonsu  [address],  [place] or [address], [place] [phone] Phone number for both [place] and [place] locations is the same.  [facility] [address], [place] [phone]   [facility] [address], [place] or [address] [phone]   [facility] [address] [place] [phone], phone; [phone], fax Sees patients 1st and 3rd Saturday of every month.  Must not qualify  for public or private insurance (i.e. Medicaid, Medicare, [place] Health Choice, Veterans' Benefits)  Household income should be no more than 200% of the poverty level The clinic cannot treat you if you are pregnant or think you are pregnant  Sexually transmitted diseases are not treated at the clinic.    Dental Care: Organization         Address  Phone  Notes  [facility] [address], [place] [phone] Accepts children up to age 42 who are enrolled in [place]icaid or [place] Health Choice; pregnant women with a Medicaid card; and children who have applied for Medicaid or [place] Health Choice, but were declined, whose parents can pay a reduced fee at time of service.  [facility] [place]  [address], [place] [phone] Accepts children up to age 42 who are enrolled in [place] or [place] Health Choice; pregnant women with a Medicaid card; and children who have applied for Medicaid or  Health Choice, but were declined, whose parents can pay a reduced fee at time of service.  [facility]  [address], [place] [phone] Patients are seen by appointment only. Walk-ins are not accepted. [facility] will see patients 42 years of age and older. Monday - Tuesday (8am-5pm) Most Wednesdays (8:30-5pm) $30 per visit, cash only  [facility]  [address], [facility] [phone] Patients are seen by appointment only. Walk-ins are not accepted. [facility] will see patients 42 years of age and older. One Wednesday Evening (Monthly: Volunteer Based).  $30 per visit, cash only  [employer] [facility] [facility]  [phone] for adults; Children under age 514, call Graduate Pediatric Dentistry at [phone]. Children aged 614-14, please call [phone] to request a pediatric application.  Dental services are provided in all areas of dental care including fillings, crowns and bridges, complete and partial dentures, implants, gum treatment, root canals, and extractions. Preventive care is also provided. Treatment is provided to both adults and children. Patients are selected via a lottery and there is often a waiting list.   [facility] [address], [place]  [phone] [email or web address]   [facility] [address], [place], [place] [phone], Ext. [phone] Second and Fourth Thursday of each month, opens at 6:30 AM; Clinic ends at 9 AM.  Patients are seen on a first-come first-served basis, and a limited number are seen during each clinic.   [facility]  [address], [place], [place] [phone]   Eligibility Requirements You must have lived in [place], [place], or [place] counties for at least the last three months.   You cannot be eligible for state or federal sponsored [place] Cityhealthcare insurance, including CIGNAVeterans Administration, [place]icaid, or [employer]icare.   You generally cannot be eligible for healthcare insurance through your employer.    How to apply: Eligibility screenings are held every Tuesday and Wednesday afternoon from 1:00 pm until 4:00 pm. You do not need an appointment for the interview!  [facility] [address], [place], [place] [phone]   [facility]  [phone]   [facility]  [phone]   [facility]  [phone]    Behavioral Health Resources in the Community: Intensive Outpatient Programs Organization         Address  Phone  Notes  [facility] [address] [place], [place] [phone]   [facility] [address], [place], [place]   ADS: Alcohol & Drug Svcs [address] [place], [place]   [place] [address],  [place], [place] or [phone]   Substance Abuse Resources Organization         Address  Phone  Notes  Alcohol and Drug Services  [phone]   [place]  [phone]   [facility] [place]   Chinita Pester  [phone]   Residential & Outpatient Substance Abuse Program  [phone]   Psychological Services Organization         Address  Phone  Notes  [facility] [place]  [place]  [phone]   [place] [address], [place] or [phone]    Mobile Crisis Teams Organization         Address  Phone  Notes  [facility], Mobile Crisis Care Unit  [phone]   [facility]ive Psychotherapeutic Services  [address] [place], [place]   [place] [address], [place] [phone]    Self-Help/Support Groups Organization         Address  Phone             Notes  [place]. of [place] - variety of support groups  [place] Call for more information  Narcotics Anonymous (NA), [facility]aring [facility] [address], [employer] [place]  2 meetings at this location   Special educational needs teacher         Address  Phone  Notes  [facility] Residential Treatment [place],    [place]  [phone]   [facility] [place]  [address], [place] [phone], [place], [place]   [place], [place]  [phone] Admissions: 8am-3pm M-F  Incentives Substance [place] [address],    [place], [place] [phone]   [facility] [address] [place], [place], [place]   [facility] [address]  [place], [place]   [facility] - Intensive Outpatient [address], [place], [place]   [facility] [place].,  [place], [place] [phone] or [phone]   [facility] Services (RTS) [address] [place], [place] Accepts Medicaid  [facility] [place] [address],  [place] [phone] Substance Abuse/Addiction Treatment   [facility] Organization         Address  Phone  Notes  [facility]  [phone]   Domenic Schwab, PhD [address] [place], [place]   [phone] or [phone]   [place]   [address] [place], [place] [phone]   [facility] [address], [place], [place] [phone] Insurance/Medicaid/sponsorship through [facility] [address] [place]                                    [place], [place] [phone] [place] [address], [place] [phone]    Dr. Adele Schilder  [phone]   [facility]  [facility]. 1) [address], [place] 2) [place] 3)  [place] [address], [place] [phone]  [phone]   [place] [phone] or [phone] (After Hours)

## 2013-10-11 NOTE — ED Notes (Signed)
Contacted lab to ensure that D Dimer was being processed

## 2013-10-15 NOTE — ED Provider Notes (Signed)
Medical screening examination/treatment/procedure(s) were performed by non-physician practitioner and as supervising physician I was immediately available for consultation/collaboration.   Celene KrasJon R Samaad Hashem, MD 10/15/13 [phone]

## 2014-01-30 ENCOUNTER — Encounter (HOSPITAL_COMMUNITY): Payer: Self-pay | Admitting: Emergency Medicine

## 2014-01-30 ENCOUNTER — Emergency Department (HOSPITAL_COMMUNITY)
Admission: EM | Admit: 2014-01-30 | Discharge: 2014-01-30 | Disposition: A | Discharge status: Home / Self Care | Payer: No Typology Code available for payment source | Attending: Emergency Medicine | Admitting: Emergency Medicine

## 2014-01-30 DIAGNOSIS — R011 Cardiac murmur, unspecified: Secondary | ICD-10-CM | POA: Insufficient documentation

## 2014-01-30 DIAGNOSIS — Z791 Long term (current) use of non-steroidal anti-inflammatories (NSAID): Secondary | ICD-10-CM | POA: Insufficient documentation

## 2014-01-30 DIAGNOSIS — R109 Unspecified abdominal pain: Secondary | ICD-10-CM | POA: Insufficient documentation

## 2014-01-30 DIAGNOSIS — R1032 Left lower quadrant pain: Secondary | ICD-10-CM

## 2014-01-30 DIAGNOSIS — F17200 Nicotine dependence, unspecified, uncomplicated: Secondary | ICD-10-CM | POA: Insufficient documentation

## 2014-01-30 HISTORY — DX: Cardiac murmur, unspecified: R01.1

## 2014-01-30 MED ORDER — NAPROXEN 500 MG PO TABS: 500.0000 mg | ORAL_TABLET | Freq: Two times a day (BID) | ORAL | Status: DC

## 2014-01-30 MED ORDER — METHOCARBAMOL 500 MG PO TABS: 500.0000 mg | ORAL_TABLET | Freq: Two times a day (BID) | ORAL | Status: DC

## 2014-01-30 NOTE — ED Notes (Signed)
Pt taken to room via wheelchair; pt's vitals taken; Amy, RN aware of pt

## 2014-01-30 NOTE — ED Notes (Signed)
Patient states "i have leg pain right where my leg meets my torso".  Patient states the pain doesn't move down his leg. When patient points to pained area, he points to groin.  Patient cannot describe pain.

## 2014-01-30 NOTE — Discharge Instructions (Signed)
Take muscle relaxer (Robaxin) as needed.  Do not drive or operate heavy machinery for 4-6 hours after taking the Robaxin.

## 2014-01-30 NOTE — ED Provider Notes (Signed)
CSN: [id number]     Arrival date & time 01/30/14  [id number] History   First MD Initiated Contact with Patient 01/30/14 [phone]     Chief Complaint  Patient presents with  . Leg Pain     (Consider location/radiation/quality/duration/timing/severity/associated sxs/prior Treatment) HPI Comments: Patient presents today with a chief complaint of left groin pain.  He reports that the pain has been constant for the past 5 days.  He denies any injury or trauma.  He reports that the pain does not radiate.  Pain is worse with movement of the left leg and walking.  He reports that he does a lot of walking.  He denies flank pain, back pain, numbness, tingling, weakness, hematuria, scrotal pain/swelling, fever, or chills.  He has not taken anything for the pain prior to arrival.    The history is provided by the patient.    Past Medical History  Diagnosis Date  . Heart murmur    History reviewed. No pertinent past surgical history. No family history on file. History  Substance Use Topics  . Smoking status: Current Every Day Smoker -- 0.50 packs/day    Types: Cigarettes  . Smokeless tobacco: Not on file  . Alcohol Use: Yes     Comment: occ    Review of Systems  All other systems reviewed and are negative.     Allergies  Review of patient's allergies indicates no known allergies.  Home Medications   Prior to Admission medications   Medication Sig Start Date End Date Taking? Authorizing Provider  HYDROcodone-acetaminophen (NORCO/VICODIN) 5-325 MG per tablet Take 1-2 tablets by mouth every 6 (six) hours as needed. 10/11/13   Mora BellmanHannah S Merrell, PA-C  ibuprofen (ADVIL,MOTRIN) 200 MG tablet Take 400 mg by mouth every 6 (six) hours as needed for fever, headache or mild pain.    Historical Provider, MD  naproxen (NAPROSYN) 500 MG tablet Take 1 tablet (500 mg total) by mouth 2 (two) times daily with a meal. 10/11/13   Ramon DredgeHannah S Merrell, PA-C   BP 120/81  Pulse 90  Temp(Src) 99 F (37.2 C) (Oral)   Resp 18  SpO2 95% Physical Exam  Nursing note and vitals reviewed. Constitutional: He appears well-developed and well-nourished.  Neck: Normal range of motion. Neck supple.  Cardiovascular: Normal rate, regular rhythm and normal heart sounds.   Pulmonary/Chest: Effort normal and breath sounds normal.  Abdominal: Soft. He exhibits no distension. There is no tenderness. There is no CVA tenderness. Hernia confirmed negative in the right inguinal area and confirmed negative in the left inguinal area.  Genitourinary: Testes normal and penis normal. Right testis shows no mass, no swelling and no tenderness. Left testis shows no mass, no swelling and no tenderness. No discharge found.  Musculoskeletal: Normal range of motion.  Pain of the left groin with movement of the left leg.  Lymphadenopathy:       Right: No inguinal adenopathy present.       Left: No inguinal adenopathy present.  Neurological: He is alert. He has normal strength. No sensory deficit. Gait normal.  Skin: Skin is warm and dry. No erythema.  Psychiatric: He has a normal mood and affect.    ED Course  Procedures (including critical care time) Labs Review Labs Reviewed - No data to display  Imaging Review No results found.   EKG Interpretation None      MDM   Final diagnoses:  None   Patient presenting with left groin pain.  Pain worse with movement  of the left leg.  Feel that the pain is most consistent with muscle strain.  Normal GU exam.  No CVA tenderness.  Feel that the patient is stable for discharge.  Return precautions given.     Santiago GladHeather Laisure, PA-C 02/01/14 2316

## 2014-02-02 NOTE — ED Provider Notes (Signed)
Medical screening examination/treatment/procedure(s) were performed by non-physician practitioner and as supervising physician I was immediately available for consultation/collaboration.   [name], MD 02/02/14 1506 

## 2014-08-02 ENCOUNTER — Emergency Department (HOSPITAL_COMMUNITY)
Admission: EM | Admit: 2014-08-02 | Discharge: 2014-08-03 | Disposition: A | Discharge status: Home / Self Care | Payer: No Typology Code available for payment source | Attending: Emergency Medicine | Admitting: Emergency Medicine

## 2014-08-02 ENCOUNTER — Encounter (HOSPITAL_COMMUNITY): Payer: Self-pay

## 2014-08-02 ENCOUNTER — Emergency Department (HOSPITAL_COMMUNITY): Payer: No Typology Code available for payment source

## 2014-08-02 DIAGNOSIS — R079 Chest pain, unspecified: Secondary | ICD-10-CM | POA: Insufficient documentation

## 2014-08-02 DIAGNOSIS — R011 Cardiac murmur, unspecified: Secondary | ICD-10-CM | POA: Insufficient documentation

## 2014-08-02 DIAGNOSIS — Z791 Long term (current) use of non-steroidal anti-inflammatories (NSAID): Secondary | ICD-10-CM | POA: Insufficient documentation

## 2014-08-02 DIAGNOSIS — Z720 Tobacco use: Secondary | ICD-10-CM | POA: Insufficient documentation

## 2014-08-02 DIAGNOSIS — Z79899 Other long term (current) drug therapy: Secondary | ICD-10-CM | POA: Insufficient documentation

## 2014-08-02 DIAGNOSIS — R002 Palpitations: Secondary | ICD-10-CM | POA: Insufficient documentation

## 2014-08-02 LAB — CBC
HCT: 42.5 % (ref 39.0–52.0)
Hemoglobin: 13.8 g/dL (ref 13.0–17.0)
MCH: 27 pg (ref 26.0–34.0)
MCHC: 32.5 g/dL (ref 30.0–36.0)
MCV: 83 fL (ref 78.0–100.0)
Platelets: 202 10*3/uL (ref 150–400)
RBC: 5.12 MIL/uL (ref 4.22–5.81)
RDW: 12.9 % (ref 11.5–15.5)
WBC: 6.5 10*3/uL (ref 4.0–10.5)

## 2014-08-02 LAB — I-STAT TROPONIN, ED: Troponin i, poc: 0 ng/mL (ref 0.00–0.08)

## 2014-08-02 MED ORDER — SODIUM CHLORIDE 0.9 % IV BOLUS (SEPSIS)
1000.0000 mL | Freq: Once | INTRAVENOUS | Status: AC
  Administered 2014-08-02: 1000 mL via INTRAVENOUS

## 2014-08-02 NOTE — ED Provider Notes (Signed)
CSN: [id number]     Arrival date & time 08/02/14  2313 History   First MD Initiated Contact with Patient 08/02/14 2327     Chief Complaint  Patient presents with  . Tachycardia   (Consider location/radiation/quality/duration/timing/severity/associated sxs/prior Treatment) HPI Derek Zimmerman is a 43 yo male presenting with report of palpitations onset tonight while lying in bed.  He reports this is a daily occurrence for him for 15 years.  He reports tonight he noticed some burning in his chest during this episode.  He called 911 and was asked to count his pulse and reports it was too fast to count.  He denies ever being evaluated for the palpitations but decided to night he should be evaluated. He endorses some occasional cocaine use, the last time was last week and regular marijuana use, the last time was today.  He denies every having a syncopal episode related to the palpitations and he denies nausea, vomiting, or diaphoresis tonight.     Past Medical History  Diagnosis Date  . Heart murmur    History reviewed. No pertinent past surgical history. No family history on file. History  Substance Use Topics  . Smoking status: Current Every Day Smoker -- 0.50 packs/day    Types: Cigarettes  . Smokeless tobacco: Not on file  . Alcohol Use: Yes     Comment: occ    Review of Systems  Constitutional: Negative for fever and chills.  HENT: Negative for sore throat.   Eyes: Negative for visual disturbance.  Respiratory: Negative for cough and shortness of breath.   Cardiovascular: Positive for chest pain and palpitations. Negative for leg swelling.  Gastrointestinal: Negative for nausea, vomiting and diarrhea.  Genitourinary: Negative for dysuria.  Musculoskeletal: Negative for myalgias.  Skin: Negative for rash.  Neurological: Negative for weakness, numbness and headaches.    Allergies  Review of patient's allergies indicates no known allergies.  Home Medications   Prior to  Admission medications   Medication Sig Start Date End Date Taking? Authorizing Provider  HYDROcodone-acetaminophen (NORCO/VICODIN) 5-325 MG per tablet Take 1-2 tablets by mouth every 6 (six) hours as needed. 10/11/13   Mora BellmanHannah S Merrell, PA-C  ibuprofen (ADVIL,MOTRIN) 200 MG tablet Take 400 mg by mouth every 6 (six) hours as needed for fever, headache or mild pain.    Historical Provider, MD  methocarbamol (ROBAXIN) 500 MG tablet Take 1 tablet (500 mg total) by mouth 2 (two) times daily. 01/30/14   Heather Laisure, PA-C  naproxen (NAPROSYN) 500 MG tablet Take 1 tablet (500 mg total) by mouth 2 (two) times daily with a meal. 10/11/13   Mora BellmanHannah S Merrell, PA-C  naproxen (NAPROSYN) 500 MG tablet Take 1 tablet (500 mg total) by mouth 2 (two) times daily. 01/30/14   Heather Laisure, PA-C   BP 104/77 mmHg  Pulse 71  Temp(Src) 98.4 F (36.9 C) (Oral)  Resp 19  SpO2 100% Physical Exam  Constitutional: He appears well-developed and well-nourished. No distress.  HENT:  Head: Normocephalic and atraumatic.  Mouth/Throat: Oropharynx is clear and moist. No oropharyngeal exudate.  Eyes: Conjunctivae are normal.  Neck: Neck supple. No thyromegaly present.  Cardiovascular: Normal rate, regular rhythm and intact distal pulses.   No murmur heard. Pulmonary/Chest: Effort normal and breath sounds normal. No respiratory distress. He has no wheezes. He has no rales. He exhibits no tenderness.  Abdominal: Soft. There is no tenderness.  Musculoskeletal: He exhibits no tenderness.  Lymphadenopathy:    He has no cervical adenopathy.  Neurological:  He is alert.  Skin: Skin is warm and dry. No rash noted. He is not diaphoretic.  Psychiatric: He has a normal mood and affect.  Nursing note and vitals reviewed.   ED Course  Procedures (including critical care time) Labs Review Labs Reviewed  CBC  BASIC METABOLIC PANEL  TSH  I-STAT TROPOININ, ED    Imaging Review Dg Chest 2 View  08/02/2014   CLINICAL DATA:   Palpitations and chest pain.  EXAM: CHEST  2 VIEW  COMPARISON:  10/11/2013  FINDINGS: Normal heart size and mediastinal contours. Probable nipple shadow overlapping the right posterior ninth rib. No acute infiltrate or edema. No effusion or pneumothorax. No acute osseous findings.  IMPRESSION: Negative chest.   Electronically Signed   By: Tiburcio PeaJonathan  Watts M.D.   On: 08/02/2014 23:52     EKG Interpretation   Date/Time:  Wednesday August 02 2014 23:20:17 EST Ventricular Rate:  72 PR Interval:  157 QRS Duration: 81 QT Interval:  361 QTC Calculation: 395 R Axis:   69 Text Interpretation:  Sinus rhythm Atrial premature complex Left atrial  enlargement RSR' in V1 or V2, right VCD or RVH ST elevation suggests acute  pericarditis or early repolarization Confirmed by DELOS  MD, DOUGLAS  [phone]([id number]) on 08/03/2014 3:17:08 AM      MDM   Final diagnoses:  Palpitations   43 yo with reports of daily palpitations and associated chest pain x 15 years.  SVT reported by EMS with normal sinus rhythm in the 70s while in the ED. CBC, BMP, Troponin and CXR without significant abnormality.  Pt's vitals signs are normal and he is afebrile.  Pt does endorse regular marijuana and cocaine use.  Pt is well-appearing, in no acute distress and vital signs are stable.  They appear safe to be discharged.  Discharge include referral for follow-up with cardiologist.  Return precautions provided.    Filed Vitals:   08/03/14 0000 08/03/14 0015 08/03/14 0030 08/03/14 0048  BP: 111/83 111/78 111/82   Pulse: 65 59 67   Temp:    98.3 F (36.8 C)  TempSrc:    Oral  Resp: 19 19 12    SpO2: 96% 99% 99%    Meds given in ED:  Medications  sodium chloride 0.9 % bolus 1,000 mL (0 mLs Intravenous Stopped 08/03/14 0049)    Discharge Medication List as of 08/03/2014 12:25 AM         Harle BattiestElizabeth Monique Hefty, NP 08/04/14 [id number]  Geoffery Lyonsouglas Delo, MD 08/06/14 [phone]

## 2014-08-02 NOTE — ED Notes (Signed)
PER EMS: pt from home, reports about 4 hours ago he started feeling palpitations and central, non-radiating chest pain, 3/10. Upon EMS arrival they report patients HR fluctuated from a rate of 155 in SVT and then HR would decrease to 70's in sinus rhythm.  HR now 78. Pt given 324 asa en route. Skin e/u, NAD, denies SOB/N/V. CBG-92. O2-100% RA. Pt reports he smoked some marijuana earlier today.

## 2014-08-03 LAB — BASIC METABOLIC PANEL
ANION GAP: 5 (ref 5–15)
BUN: 10 mg/dL (ref 6–23)
CHLORIDE: 108 meq/L (ref 96–112)
CO2: 26 mmol/L (ref 19–32)
Calcium: 8.6 mg/dL (ref 8.4–10.5)
Creatinine, Ser: 0.97 mg/dL (ref 0.50–1.35)
GFR calc Af Amer: >90 mL/min (ref >90)
Glucose, Bld: 91 mg/dL (ref 70–99)
POTASSIUM: 3.5 mmol/L (ref 3.5–5.1)
SODIUM: 139 mmol/L (ref 135–145)

## 2014-08-03 LAB — TSH: TSH: 1.583 u[IU]/mL (ref 0.350–4.500)

## 2014-08-03 NOTE — Discharge Instructions (Signed)
Please follow the directions provided.  Be sure to establish care with the cardiologist to further evaluate these palpitations.  Please avoid cocaine and marijuana as this may make your symptoms worse.  Don't hesitate to return for any new, worsening or concerning symptoms.     SEEK IMMEDIATE MEDICAL CARE IF:  You have chest pain or shortness of breath.  You have a severe headache.  You feel dizzy or you faint.

## 2015-08-27 IMAGING — CR DG CHEST 2V
2 series · 2 of 2 positions shown · non-contrast
Comparison: November 26, 2012

CLINICAL DATA: Chest pain

EXAM:
CHEST  2 VIEW

[w chest pa]
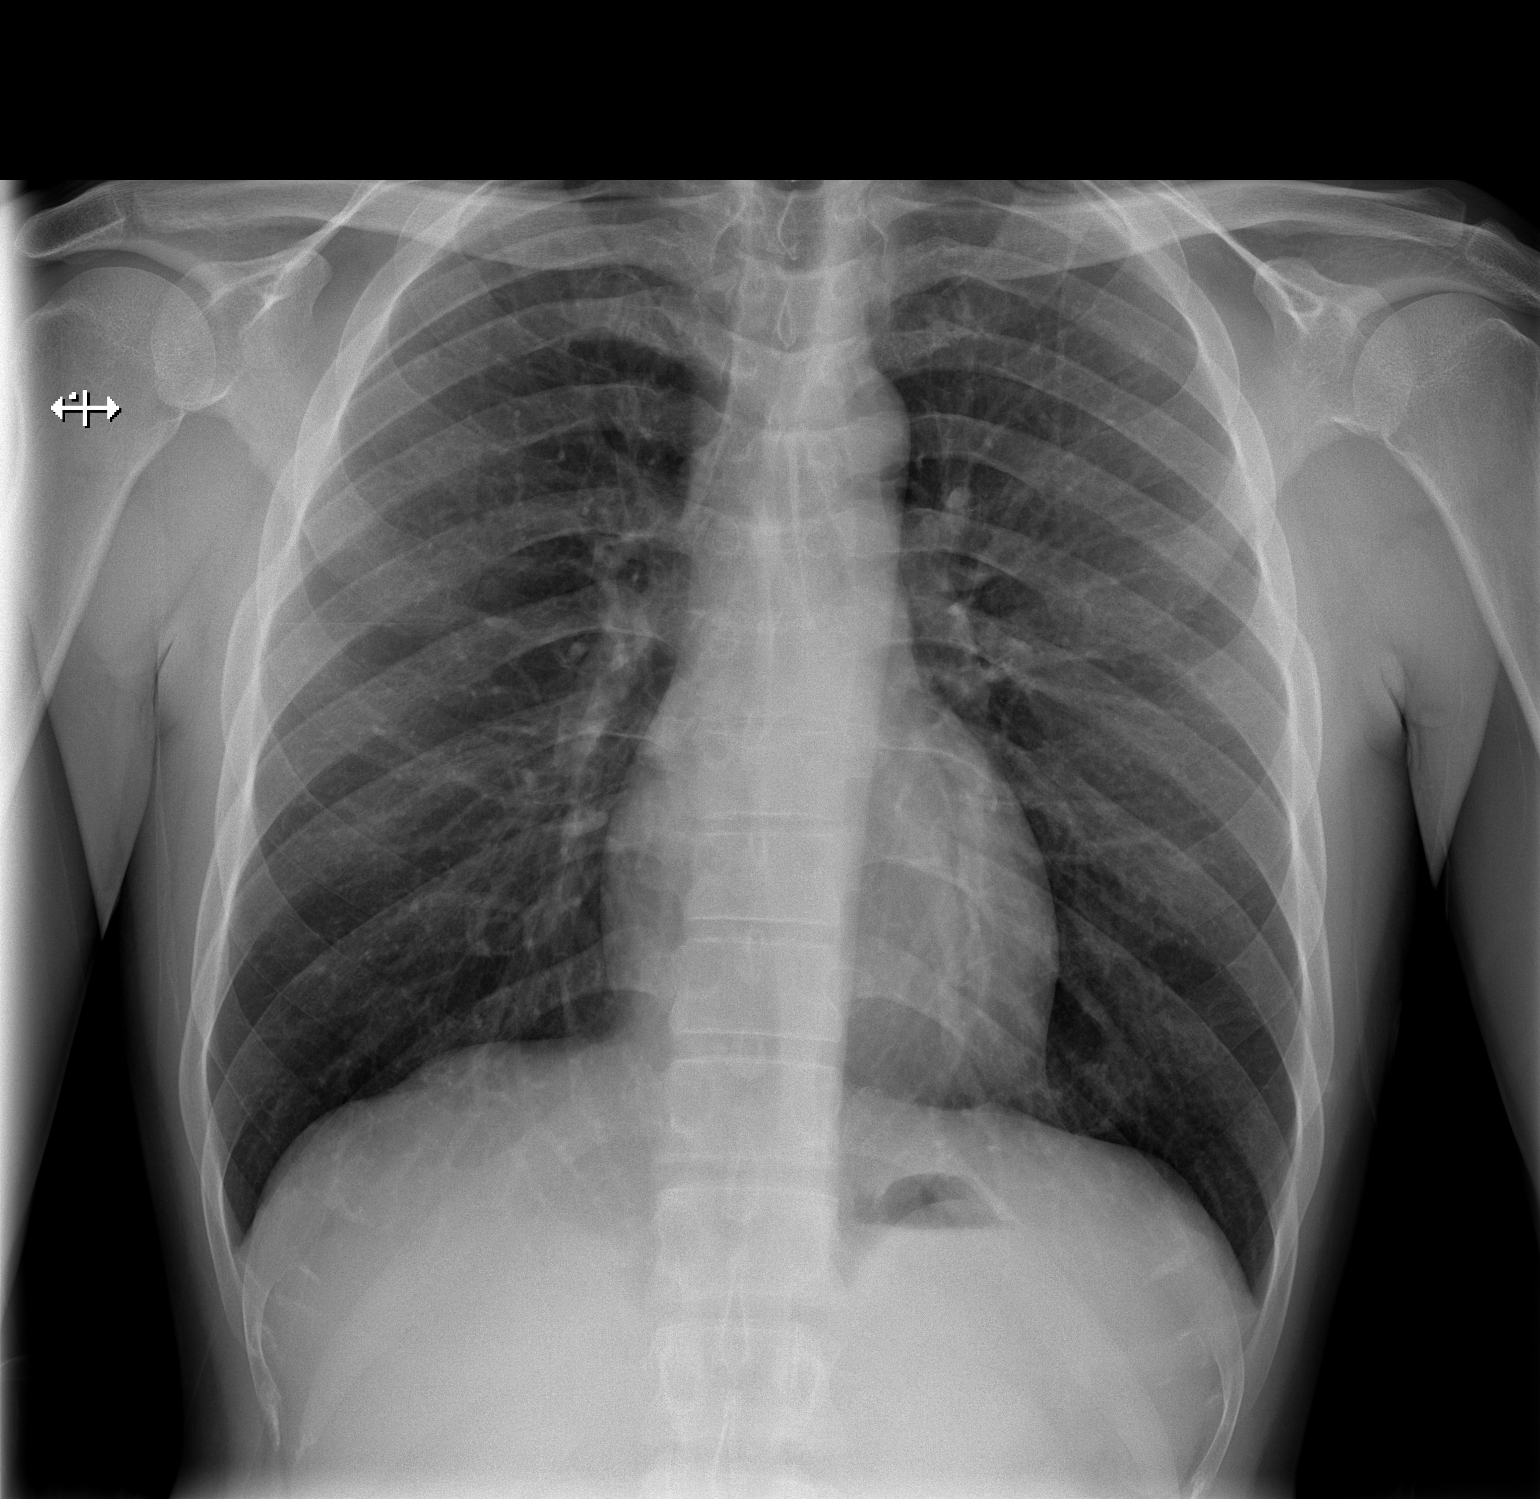

[w chest lat]
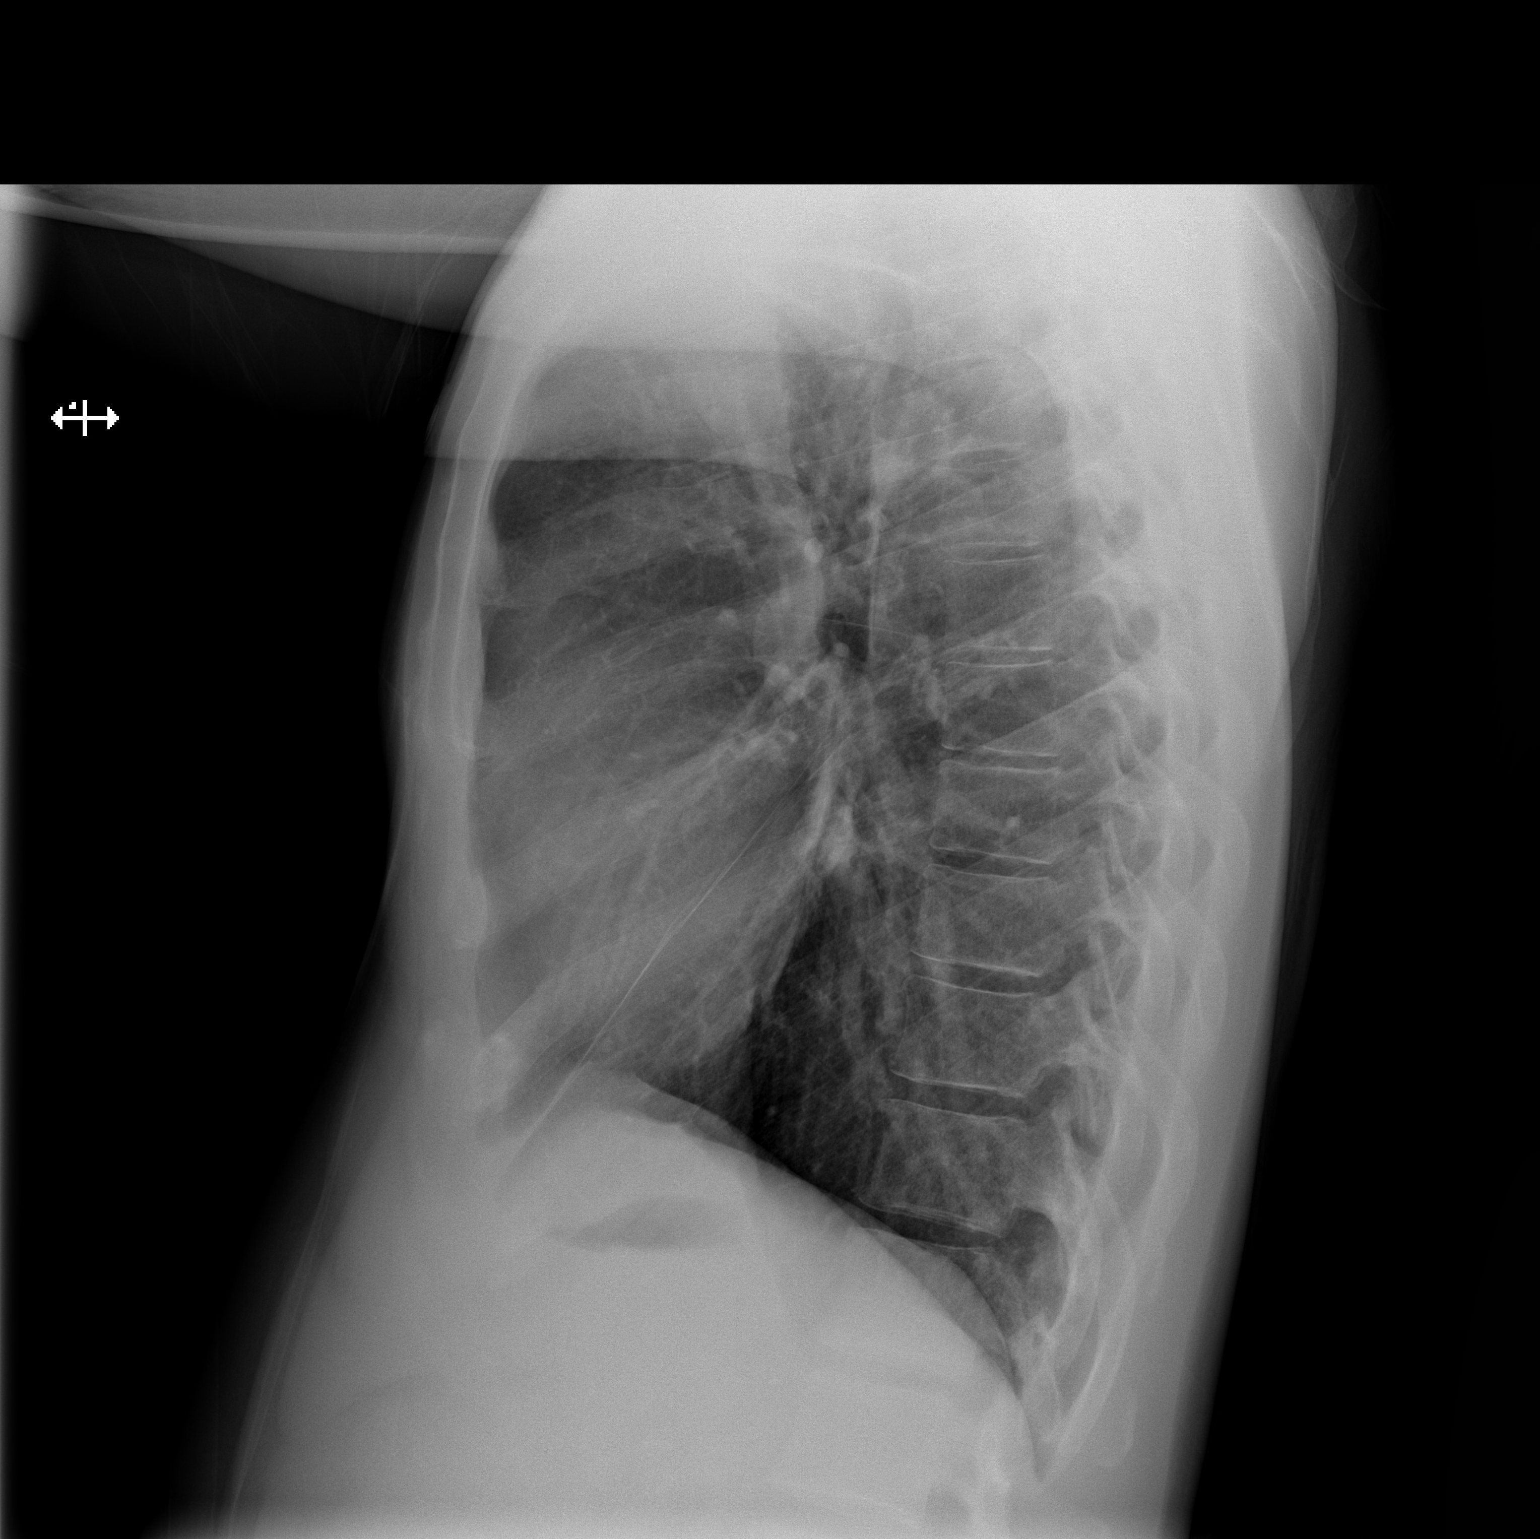

[2 of 2 positions shown; findings below may reference images not displayed]

FINDINGS: The lungs are clear. The heart size and pulmonary vascularity are
normal. No adenopathy. No pneumothorax. No bone lesions.
IMPRESSION: No abnormality noted.

## 2016-06-17 IMAGING — DX DG CHEST 2V
2 series · 2 of 2 positions shown · non-contrast
Comparison: 10/11/2013

CLINICAL DATA: Palpitations and chest pain.

EXAM:
CHEST  2 VIEW

[chest pa]
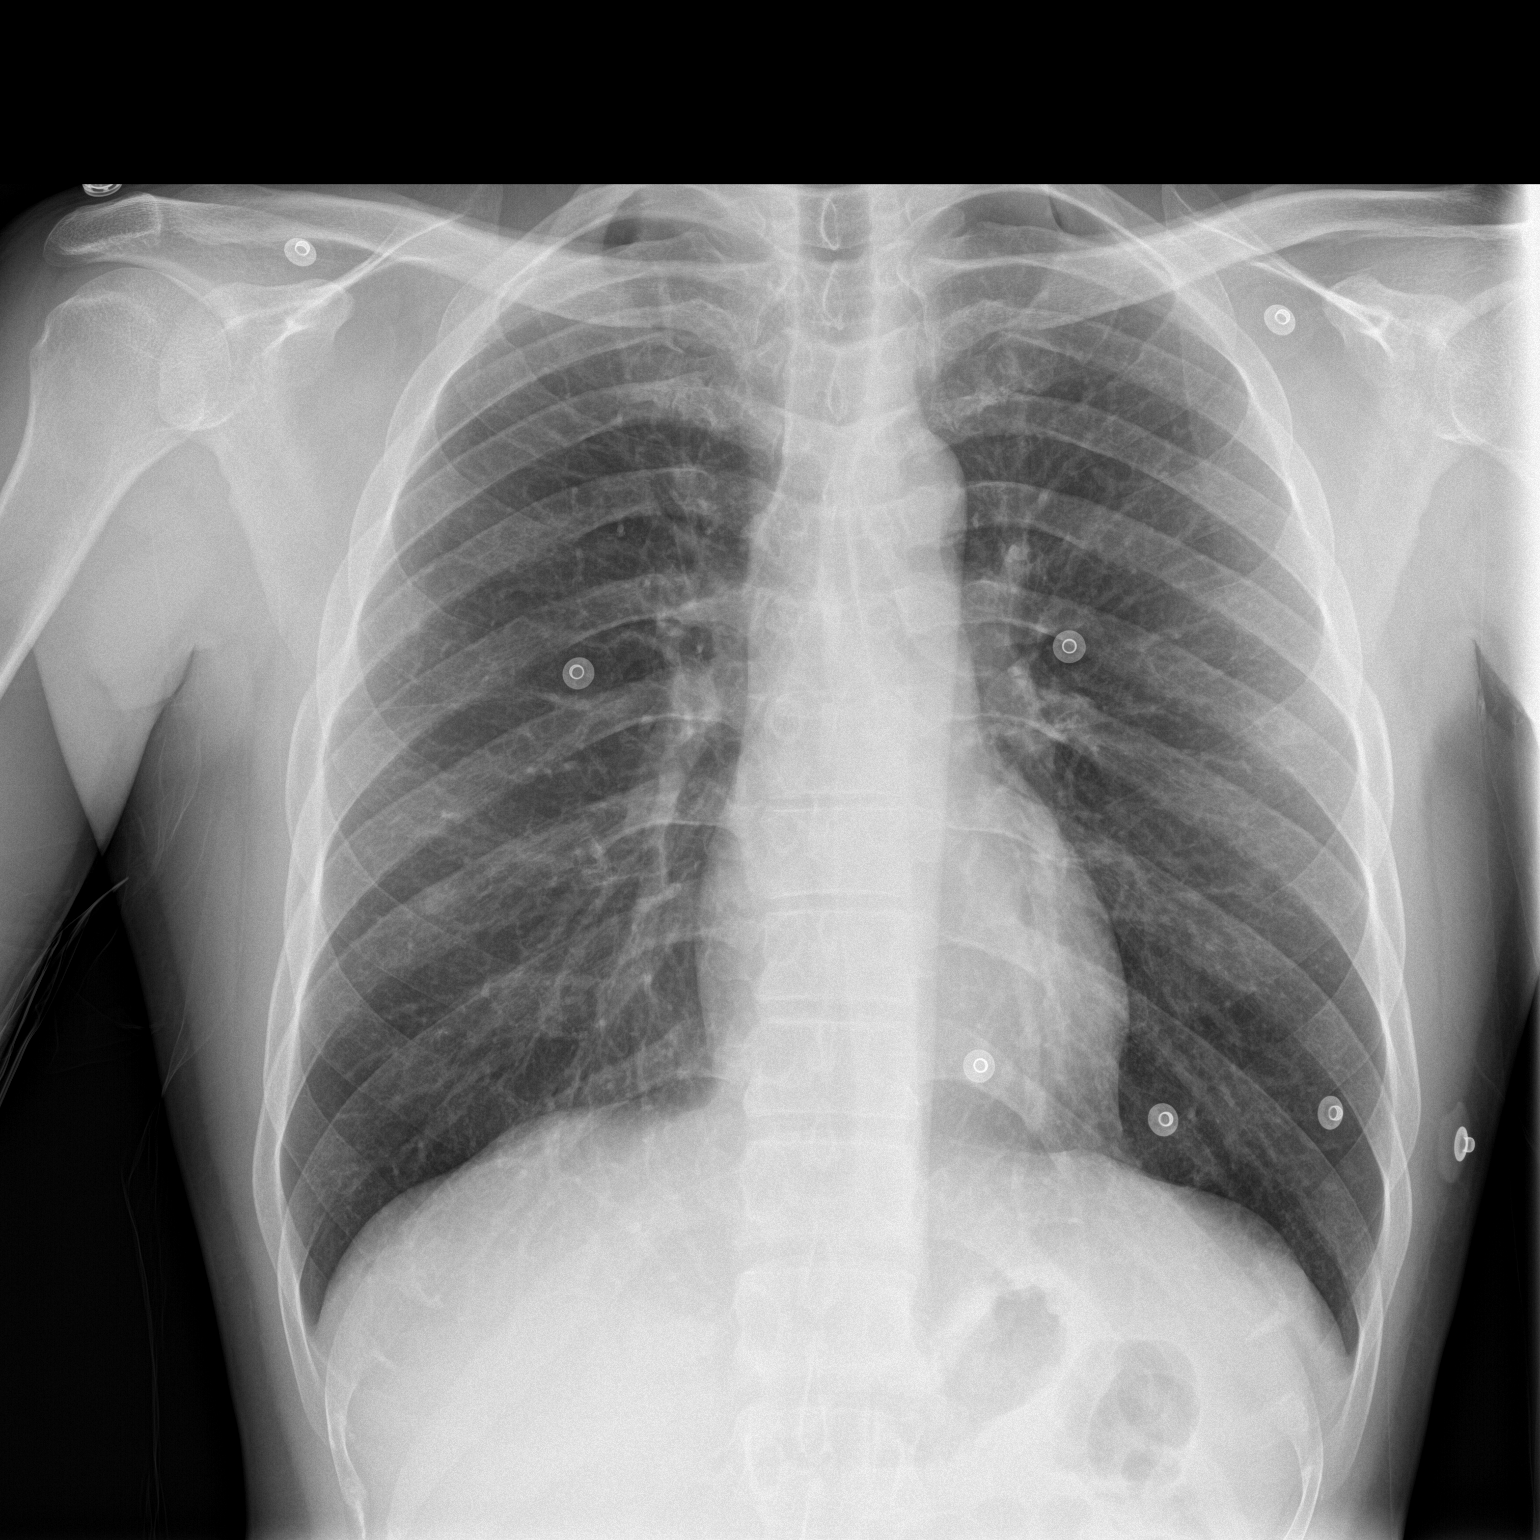

[chest lat]
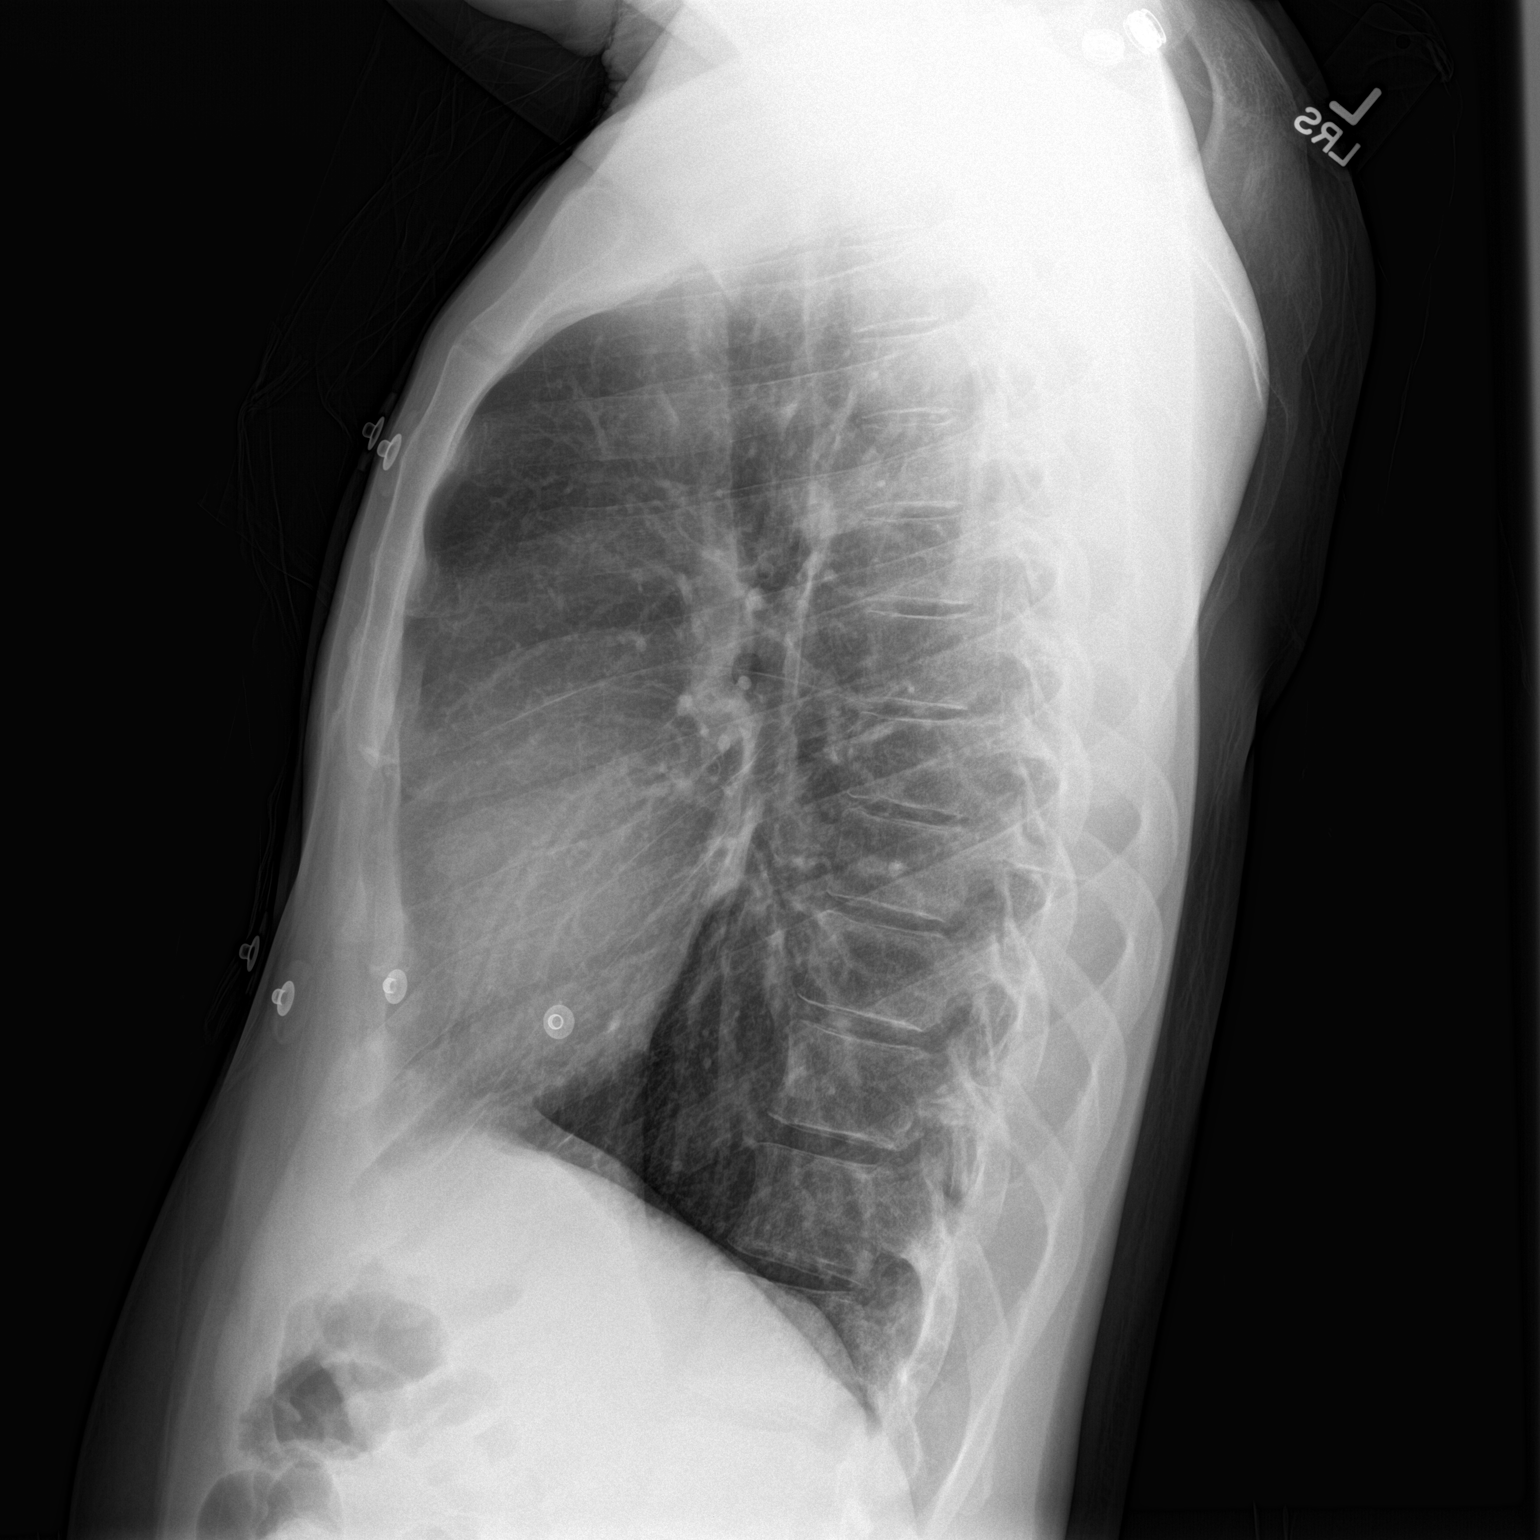

[2 of 2 positions shown; findings below may reference images not displayed]

FINDINGS: Normal heart size and mediastinal contours. Probable nipple shadow
overlapping the right posterior ninth rib. No acute infiltrate or
edema. No effusion or pneumothorax. No acute osseous findings.
IMPRESSION: Negative chest.

## 2022-12-02 DIAGNOSIS — F341 Dysthymic disorder: Secondary | ICD-10-CM | POA: Diagnosis not present

## 2022-12-31 DIAGNOSIS — F1991 Other psychoactive substance use, unspecified, in remission: Secondary | ICD-10-CM | POA: Diagnosis not present

## 2022-12-31 DIAGNOSIS — Z114 Encounter for screening for human immunodeficiency virus [HIV]: Secondary | ICD-10-CM | POA: Diagnosis not present

## 2022-12-31 DIAGNOSIS — Z113 Encounter for screening for infections with a predominantly sexual mode of transmission: Secondary | ICD-10-CM | POA: Diagnosis not present

## 2023-01-07 DIAGNOSIS — A530 Latent syphilis, unspecified as early or late: Secondary | ICD-10-CM | POA: Diagnosis not present

## 2023-01-14 DIAGNOSIS — A530 Latent syphilis, unspecified as early or late: Secondary | ICD-10-CM | POA: Diagnosis not present

## 2023-02-13 DIAGNOSIS — F329 Major depressive disorder, single episode, unspecified: Secondary | ICD-10-CM | POA: Diagnosis not present

## 2023-02-20 DIAGNOSIS — F329 Major depressive disorder, single episode, unspecified: Secondary | ICD-10-CM | POA: Diagnosis not present

## 2023-02-23 ENCOUNTER — Ambulatory Visit: Payer: Medicaid Other | Admitting: Physician Assistant

## 2023-02-23 ENCOUNTER — Encounter: Payer: Self-pay | Admitting: Physician Assistant

## 2023-02-23 VITALS — BP 110/68 | HR 63 | Ht 73.0 in | Wt 183.0 lb

## 2023-02-23 DIAGNOSIS — F411 Generalized anxiety disorder: Secondary | ICD-10-CM

## 2023-02-23 DIAGNOSIS — E559 Vitamin D deficiency, unspecified: Secondary | ICD-10-CM | POA: Diagnosis not present

## 2023-02-23 DIAGNOSIS — R002 Palpitations: Secondary | ICD-10-CM

## 2023-02-23 DIAGNOSIS — Z125 Encounter for screening for malignant neoplasm of prostate: Secondary | ICD-10-CM

## 2023-02-23 DIAGNOSIS — F1410 Cocaine abuse, uncomplicated: Secondary | ICD-10-CM

## 2023-02-23 DIAGNOSIS — F1010 Alcohol abuse, uncomplicated: Secondary | ICD-10-CM

## 2023-02-23 DIAGNOSIS — F17210 Nicotine dependence, cigarettes, uncomplicated: Secondary | ICD-10-CM

## 2023-02-23 DIAGNOSIS — R202 Paresthesia of skin: Secondary | ICD-10-CM

## 2023-02-23 NOTE — Patient Instructions (Signed)
We will call you with today's lab results.  I do encourage you to keep note of any episodes of palpitations that you may experience.  You will follow-up with the mobile unit in 2 weeks.   Roney Jaffe, PA-C Physician Assistant [facility] [email or web address]   Paresthesia Paresthesia is an abnormal burning or prickling sensation. It is usually felt in the hands, arms, legs, or feet. However, it may occur in any part of the body. Usually, paresthesia is not painful. It may feel like: Tingling or numbness. Buzzing. Itching. Paresthesia may occur without any clear cause, or it may be caused by: Breathing too quickly (hyperventilation). Pressure on a nerve. An underlying medical condition. Side effects of a medicine. Nutritional deficiencies. Exposure to toxic chemicals. Most people experience temporary (transient) paresthesia at some time in their lives. For some people, it may be long-lasting (chronic) because of an underlying medical condition. If you have paresthesia that lasts a long time, you need to be evaluated by your health care provider. Follow these instructions at home: Nutrition Eat a healthy diet. This includes: Eating foods that are high in fiber, such as beans, whole grains, and fresh fruits and vegetables. Limiting foods that are high in fat and processed sugars, such as fried or sweet foods.  Alcohol use  Avoid or limit alcohol. Too much alcohol can cause a vitamin B deficiency, and vitamin B is needed for healthy nerves. Do not drink alcohol if: Your health care provider tells you not to drink. You are pregnant, may be pregnant, or are planning to become pregnant. If you drink alcohol: Limit how much you have to: 0-1 drink a day for women. 0-2 drinks a day for men. Know how much alcohol is in your drink. In the [place] one drink equals one 12 oz bottle of beer (355 mL), one 5 oz glass of wine (148 mL), or  one 1 oz glass of hard liquor (44 mL). General instructions Take over-the-counter and prescription medicines only as told by your health care provider. Do not use any products that contain nicotine or tobacco. These products include cigarettes, chewing tobacco, and vaping devices, such as e-cigarettes. If you need help quitting, ask your health care provider. If you have diabetes, work closely with your health care provider to keep your blood sugar under control. If you have numbness in your feet: Check every day for signs of injury or infection. Watch for redness, warmth, and swelling. Wear padded socks and comfortable shoes. These help protect your feet. Keep all follow-up visits. This is important. Contact a health care provider if you: Have paresthesia that gets worse or does not go away. Have numbness after an injury. Have a burning or prickling feeling that gets worse when you walk. Have pain, cramps, or dizziness, or you faint. Develop a rash. Get help right away if you: Feel muscle weakness. Develop new weakness in an arm or leg. Have trouble walking or moving. Have problems with speech, understanding, or vision. Feel confused. Cannot control your bladder or bowel movements. These symptoms may be an emergency. Get help right away. Call 911. Do not wait to see if the symptoms will go away. Do not drive yourself to the hospital. Summary Paresthesia is an abnormal burning or prickling sensation that is usually felt in the hands, arms, legs, or feet. It may also occur in other parts of the body. Paresthesia may occur without any clear cause, or it may be caused by breathing too quickly (hyperventilation),  pressure on a nerve, an underlying medical condition, side effects of a medicine, nutritional deficiencies, or exposure to toxic chemicals. If you have paresthesia that lasts a long time, you need to be evaluated by your health care provider. This information is not intended to  replace advice given to you by your health care provider. Make sure you discuss any questions you have with your health care provider. Document Revised: 04/01/2021 Document Reviewed: 04/01/2021 Elsevier Patient Education  2024 [employer].

## 2023-02-23 NOTE — Progress Notes (Unsigned)
New Patient Office Visit  Subjective    Patient ID: Derek Zimmerman, male    DOB: Sep 25, 1970  Age: 52 y.o. MRN: [id number]  CC:  Chief Complaint  Patient presents with  . left Foot numbness    No prior injuries to left. Fracture to right foot x2 years ago     HPI Derek Zimmerman July 11  Past coule months - left foot  Just on outside up to heelp and up to leg  - tip of right great toe - tip of right index finger    Starting to let up on toes   Was living in [place] - moved back in April   Heart palpatations  - will feel heart beating faster on occasion - will hold berath for a quick of    Outpatient Encounter Medications as of 02/23/2023  Medication Sig  . sertraline (ZOLOFT) 50 MG tablet Take 50 mg by mouth daily.  . traZODone (DESYREL) 100 MG tablet Take 100 mg by mouth at bedtime as needed for sleep.  Marland Kitchen acetaminophen (TYLENOL) 325 MG tablet Take 325-650 mg by mouth every 6 (six) hours as needed for moderate pain.  Marland Kitchen HYDROcodone-acetaminophen (NORCO/VICODIN) 5-325 MG per tablet Take 1-2 tablets by mouth every 6 (six) hours as needed. (Patient not taking: Reported on 08/03/2014)  . methocarbamol (ROBAXIN) 500 MG tablet Take 1 tablet (500 mg total) by mouth 2 (two) times daily. (Patient not taking: Reported on 08/03/2014)  . naproxen (NAPROSYN) 500 MG tablet Take 1 tablet (500 mg total) by mouth 2 (two) times daily with a meal. (Patient not taking: Reported on 08/03/2014)  . naproxen (NAPROSYN) 500 MG tablet Take 1 tablet (500 mg total) by mouth 2 (two) times daily. (Patient not taking: Reported on 08/03/2014)   No facility-administered encounter medications on file as of 02/23/2023.    Past Medical History:  Diagnosis Date  . Heart murmur     No past surgical history on file.  No family history on file.  Social History   Socioeconomic History  . Marital status: Single    Spouse name: Not on file  . Number of children: Not on file  . Years of education: Not on  file  . Highest education level: Not on file  Occupational History  . Not on file  Tobacco Use  . Smoking status: Every Day    Current packs/day: 0.50    Types: Cigarettes  . Smokeless tobacco: Not on file  Substance and Sexual Activity  . Alcohol use: Yes    Comment: occ  . Drug use: Yes    Types: Marijuana  . Sexual activity: Not on file  Other Topics Concern  . Not on file  Social History Narrative  . Not on file   Social Determinants of Health   Financial Resource Strain: Not on file  Food Insecurity: Not on file  Transportation Needs: Not on file  Physical Activity: Not on file  Stress: Not on file  Social Connections: Not on file  Intimate Partner Violence: Not on file    ROS      Objective    BP 110/68 (BP Location: Left Arm, Patient Position: Sitting, Cuff Size: Large)   Pulse 63   Ht 6\' 1"  (1.854 m)   Wt 183 lb (83 kg)   SpO2 98%   BMI 24.14 kg/m   Physical Exam  {Labs (Optional):23779}    Assessment & Plan:   Problem List Items Addressed This Visit   None  No follow-ups on file.   Kasandra Knudsen Mayers, PA-C

## 2023-02-24 ENCOUNTER — Encounter: Payer: Self-pay | Admitting: Physician Assistant

## 2023-02-24 DIAGNOSIS — F1410 Cocaine abuse, uncomplicated: Secondary | ICD-10-CM | POA: Insufficient documentation

## 2023-02-24 DIAGNOSIS — F411 Generalized anxiety disorder: Secondary | ICD-10-CM | POA: Insufficient documentation

## 2023-02-24 DIAGNOSIS — E559 Vitamin D deficiency, unspecified: Secondary | ICD-10-CM | POA: Insufficient documentation

## 2023-02-24 LAB — TSH: TSH: 0.575 u[IU]/mL (ref 0.450–4.500)

## 2023-02-24 LAB — CBC WITH DIFFERENTIAL/PLATELET
Basophils Absolute: 0 10*3/uL (ref 0.0–0.2)
Basos: 0 %
EOS (ABSOLUTE): 0.2 10*3/uL (ref 0.0–0.4)
Eos: 5 %
Hematocrit: 39.6 % (ref 37.5–51.0)
Hemoglobin: 12.5 g/dL — ABNORMAL LOW (ref 13.0–17.7)
Immature Grans (Abs): 0 10*3/uL (ref 0.0–0.1)
Immature Granulocytes: 0 %
Lymphocytes Absolute: 0.9 10*3/uL (ref 0.7–3.1)
Lymphs: 23 %
MCH: 26.6 pg (ref 26.6–33.0)
MCHC: 31.6 g/dL (ref 31.5–35.7)
MCV: 84 fL (ref 79–97)
Monocytes Absolute: 0.4 10*3/uL (ref 0.1–0.9)
Monocytes: 11 %
Neutrophils Absolute: 2.3 10*3/uL (ref 1.4–7.0)
Neutrophils: 61 %
Platelets: 168 10*3/uL (ref 150–450)
RBC: 4.7 x10E6/uL (ref 4.14–5.80)
RDW: 13 % (ref 11.6–15.4)
WBC: 3.8 10*3/uL (ref 3.4–10.8)

## 2023-02-24 LAB — COMP. METABOLIC PANEL (12)
AST: 39 IU/L (ref 0–40)
Albumin: 4.3 g/dL (ref 4.1–5.1)
Alkaline Phosphatase: 79 IU/L (ref 44–121)
BUN/Creatinine Ratio: 15 (ref 9–20)
BUN: 14 mg/dL (ref 6–24)
Bilirubin Total: 0.2 mg/dL (ref 0.0–1.2)
Calcium: 9.4 mg/dL (ref 8.7–10.2)
Chloride: 99 mmol/L (ref 96–106)
Creatinine, Ser: 0.96 mg/dL (ref 0.76–1.27)
Globulin, Total: 2.8 g/dL (ref 1.5–4.5)
Glucose: 109 mg/dL — ABNORMAL HIGH (ref 70–99)
Potassium: 4.4 mmol/L (ref 3.5–5.2)
Sodium: 138 mmol/L (ref 134–144)
Total Protein: 7.1 g/dL (ref 6.0–8.5)
eGFR: 96 mL/min/{1.73_m2} (ref >59)

## 2023-02-24 LAB — VITAMIN D 25 HYDROXY (VIT D DEFICIENCY, FRACTURES): Vit D, 25-Hydroxy: 23.9 ng/mL — ABNORMAL LOW (ref 30.0–100.0)

## 2023-02-24 LAB — PSA: Prostate Specific Ag, Serum: 1.6 ng/mL (ref 0.0–4.0)

## 2023-02-24 LAB — VITAMIN B12: Vitamin B-12: 427 pg/mL (ref 232–1245)

## 2023-02-24 MED ORDER — PREDNISONE 20 MG PO TABS: ORAL_TABLET | ORAL | 0 refills | Status: AC

## 2023-02-24 NOTE — Addendum Note (Signed)
Addended by: Roney Jaffe on: 02/24/2023 09:40 AM   Modules accepted: Orders
